# Patient Record
Sex: Female | Born: 1960 | Race: White | Hispanic: No | Marital: Married
Health system: Midwestern US, Academic
[De-identification: ages and names within clinical notes are randomized; demographics above are authoritative.]

---

## 2020-11-16 IMAGING — US ABDCM
1 series · 13 of 25 positions shown · non-contrast
Comparison: none

[Series 1: us abdomen complete · 13 of 47 slices shown]
[im 1/47]
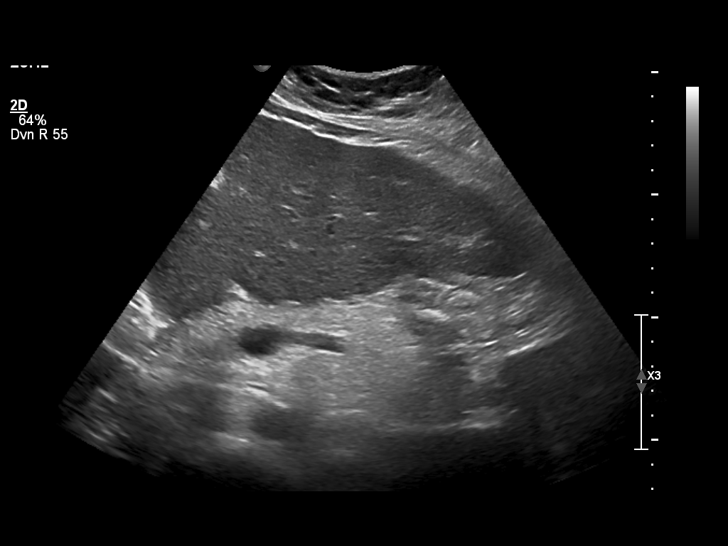
[im 4/47]
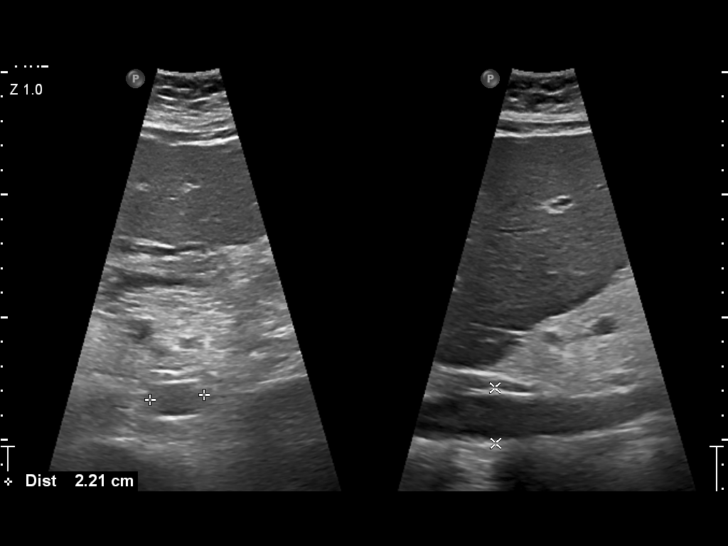
[im 8/47]
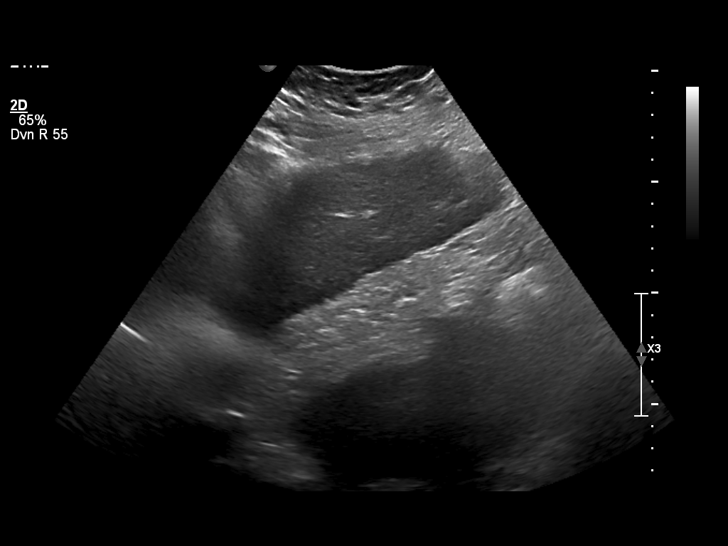
[im 12/47]
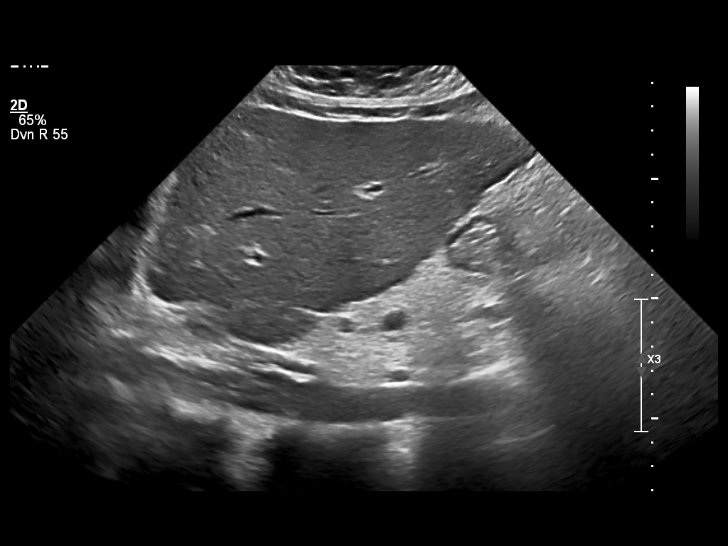
[im 16/47]
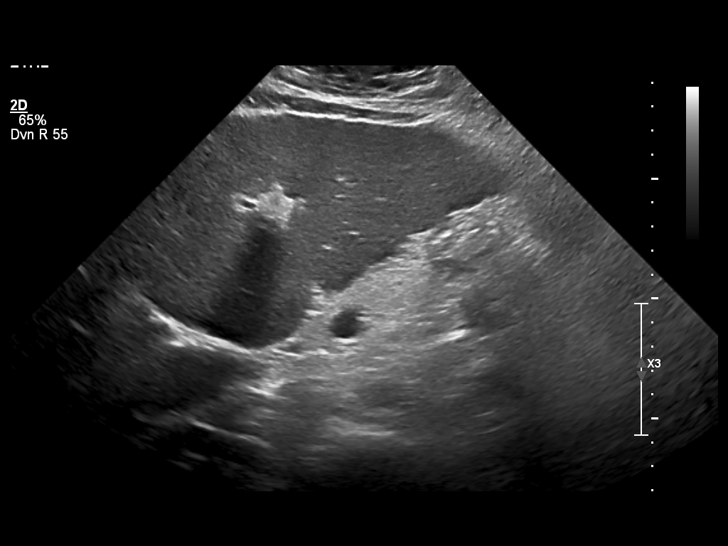
[im 20/47]
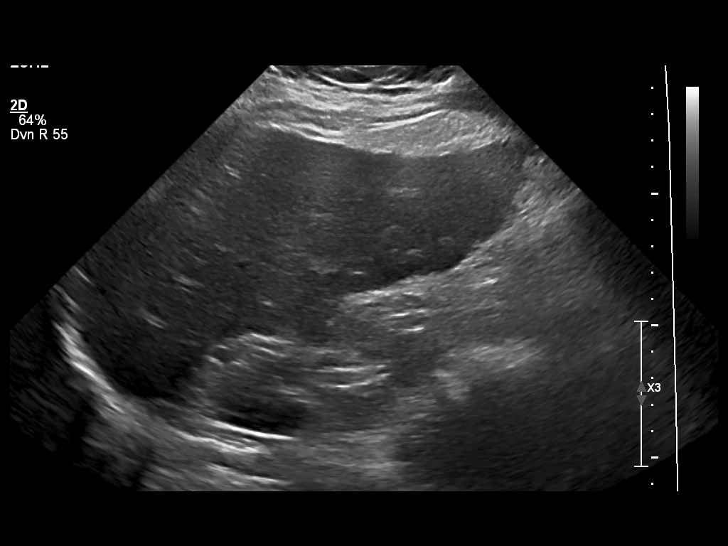
[im 24/47]
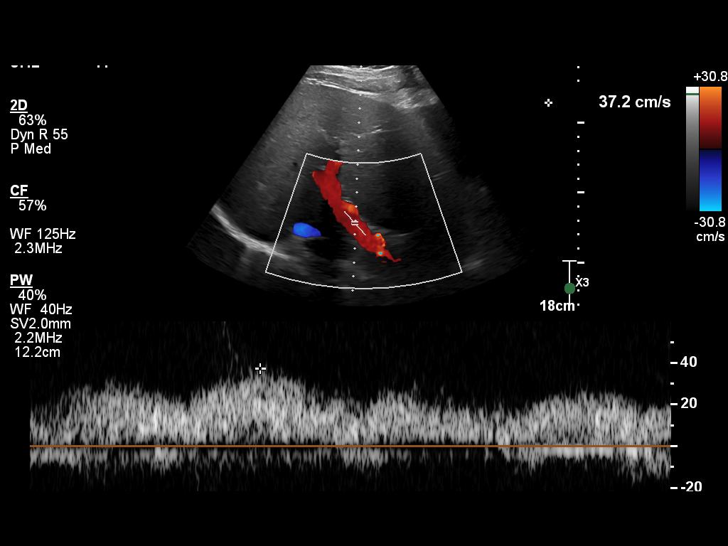
[im 27/47]
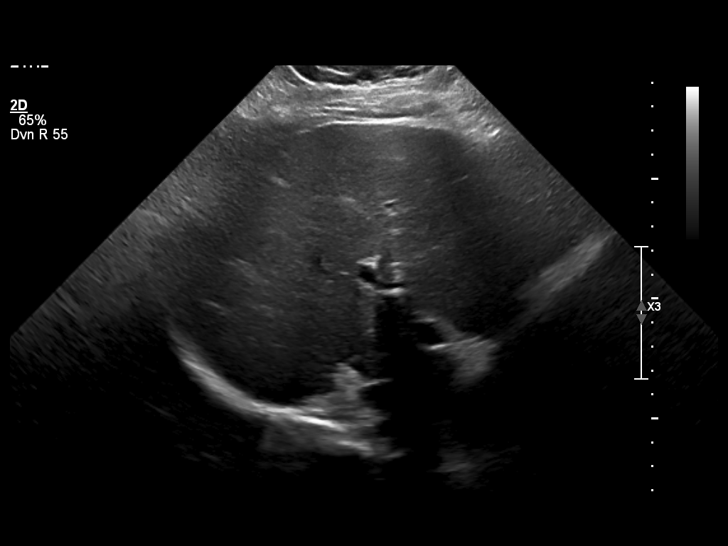
[im 31/47]
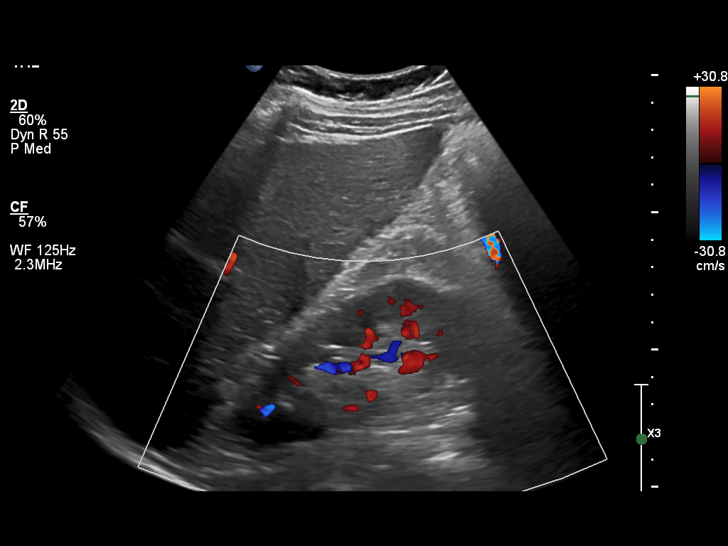
[im 35/47]
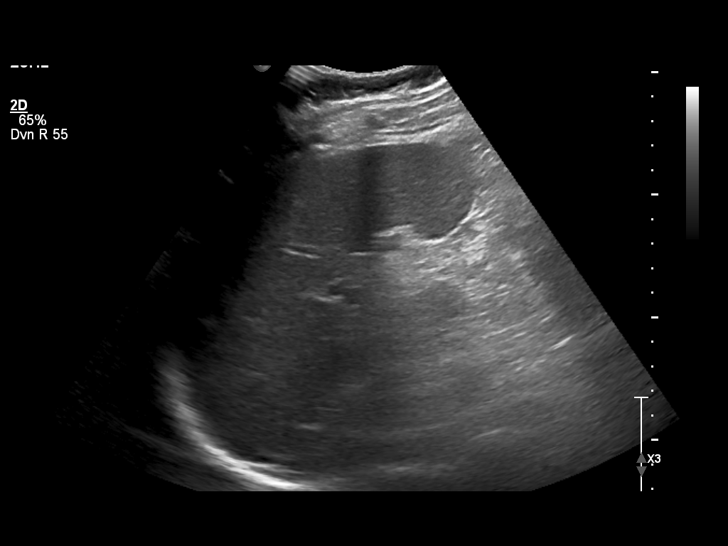
[im 39/47]
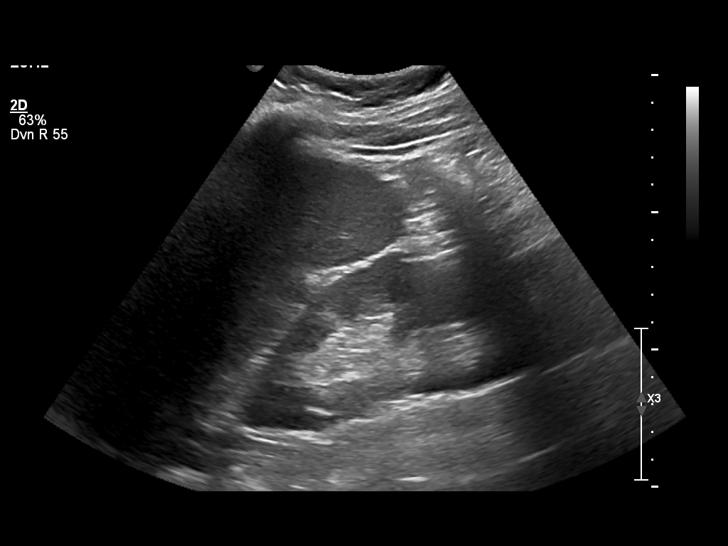
[im 43/47]
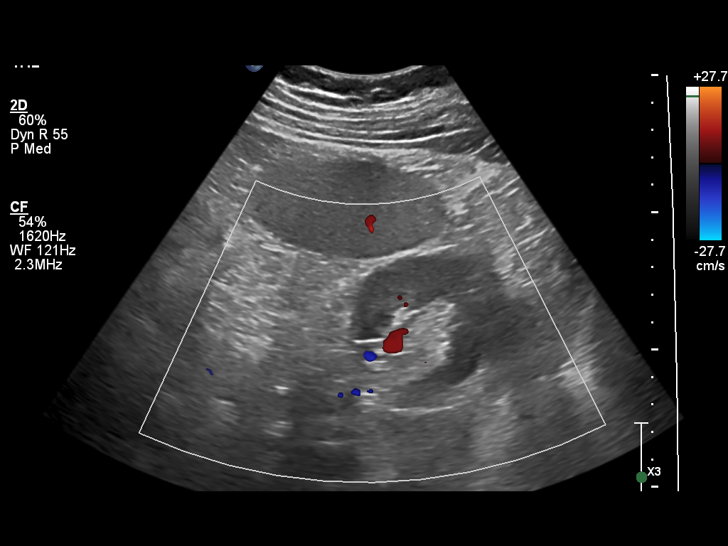
[im 47/47]
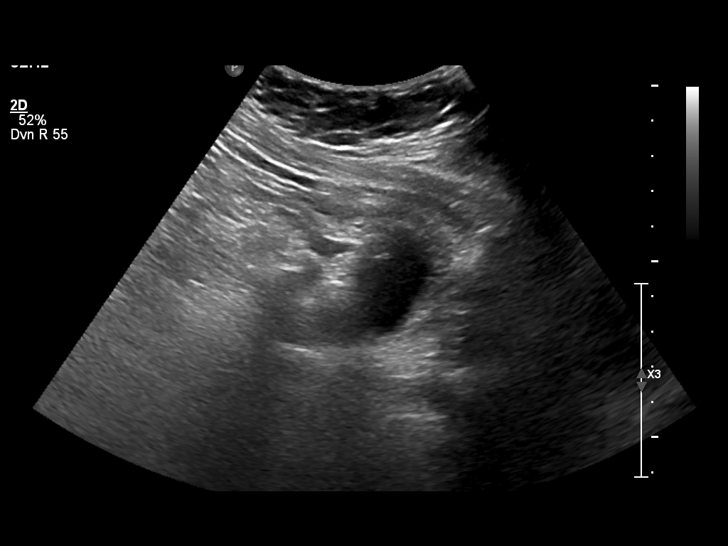

[13 of 25 positions shown; findings below may reference images not displayed]

EXAM

ULTRASOUND, ABDOMINAL, REAL TIME WITH IMAGE DOCUMENTATION, COMPLETE; CPT 94944

INDICATION

Cirrhosis. TB

TECHNIQUE

Multiple static grayscale images of the abdomen were generated utilizing a curved 5MHz probe.

COMPARISONS

Reference is made to CT scan of the abdomen and pelvis dated 07/01/2020.

FINDINGS

The liver measures approximately 18.2 cm in length. This is consistent with hepatomegaly. Nodular
contour of the liver is in keeping with the patient's underlying diagnosis of cirrhosis. There are
no focal hepatic masses identified.

The spleen is enlarged measuring 15 cm. No focal masses are noted. This is suggestive of some
degree of portal hypertension.

There is no evidence of gallstones, pericholecystic fluid or focal gallbladder wall thickening. The
common bile duct measures 4.4 mm. The gallbladder is surgically absent.

The right kidney measures 11.4 x 4.7 x 5.5 cm. The left kidney measures  12 x 5.4 x 4.6 cm. There
are no solid or cystic renal masses. There is no evidence of hydronephrosis or obstructive uropathy.
The renal echotexture is within normal limits.

The aorta and inferior vena cava appear unremarkable. Pancreatic head and body appear within normal
limits. The pancreatic tail is obscured by overlying bowel gas.

IMPRESSION

1. Hepatosplenomegaly. Nodular contour of the liver is in keeping with the patient's underlying
diagnosis of cirrhosis. There are no focal hepatic masses are identified.

2. The gallbladder is surgically absent. The pancreatic tail is obscured by overlying bowel gas.

Tech Notes:

TB

## 2020-12-08 IMAGING — MR L-spine^Routine
4 series · 15 of 15 positions shown · non-contrast
Comparison: none

[Series 2: T2 · sagittal · 4.0mm · 0.62mm/px · 5 of 5 slices shown]
[im 1/5]
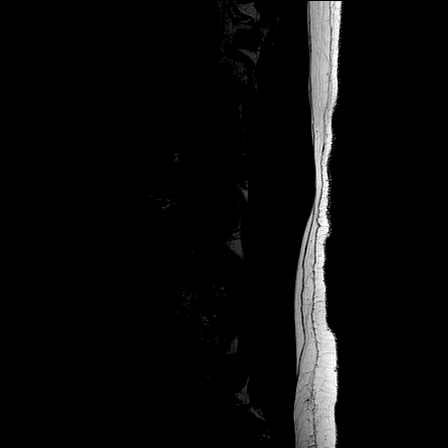
[im 2/5]
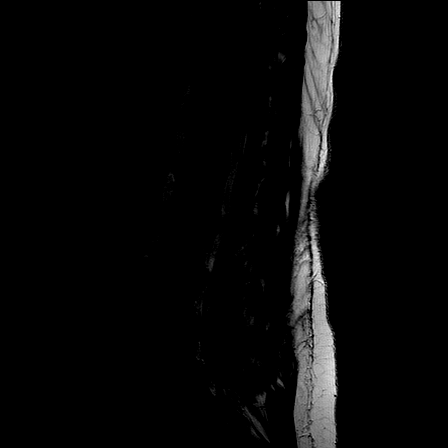
[im 3/5]
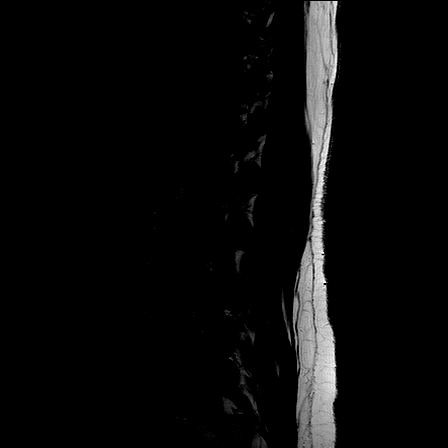
[im 4/5]
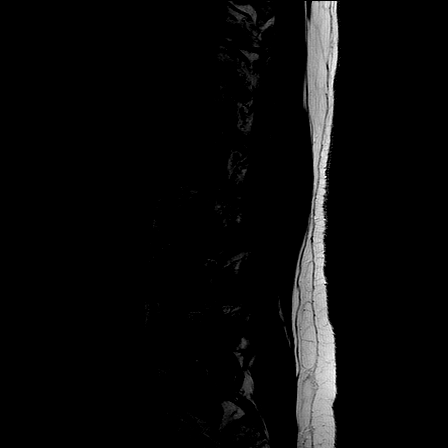
[im 5/5]
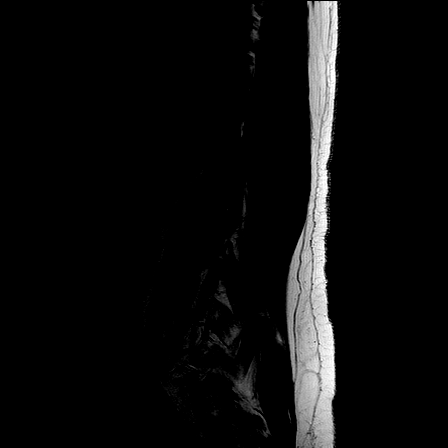

[Series 3: T1 · sagittal · 4.0mm · 0.73mm/px · 6 of 6 slices shown (1 of 2)]
[im 1/6]
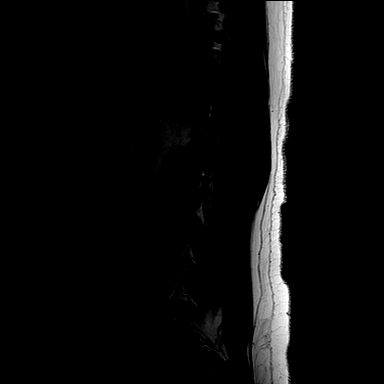
[im 2/6]
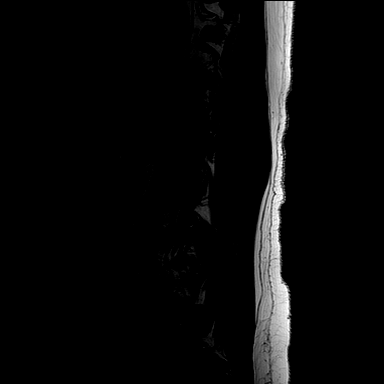
[im 3/6]
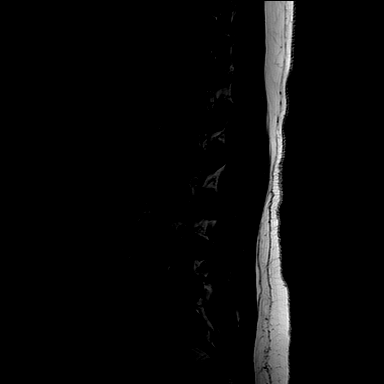
[im 4/6]
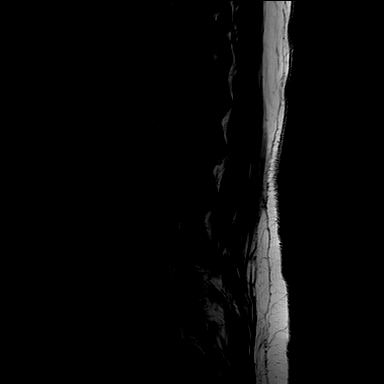
[im 5/6]
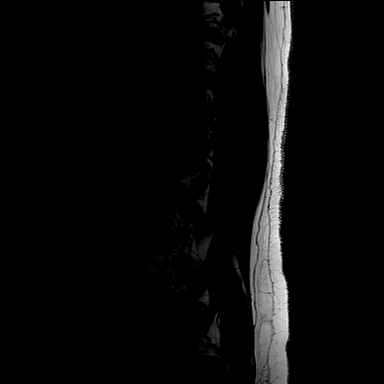
[im 6/6]
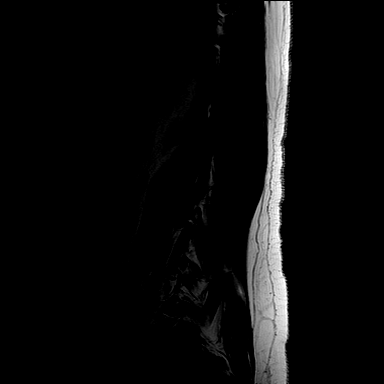

[Series 4: STIR · sagittal · 4.0mm · 0.55mm/px · 3 of 3 slices shown]
[im 1/3]
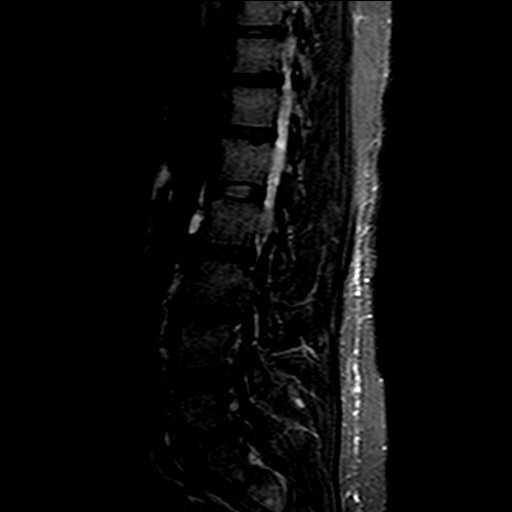
[im 2/3]
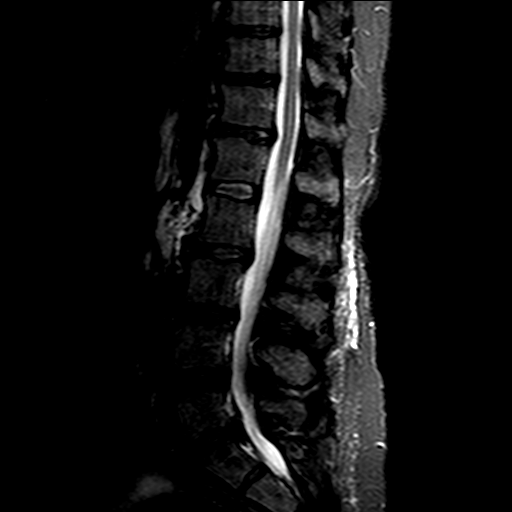
[im 3/3]
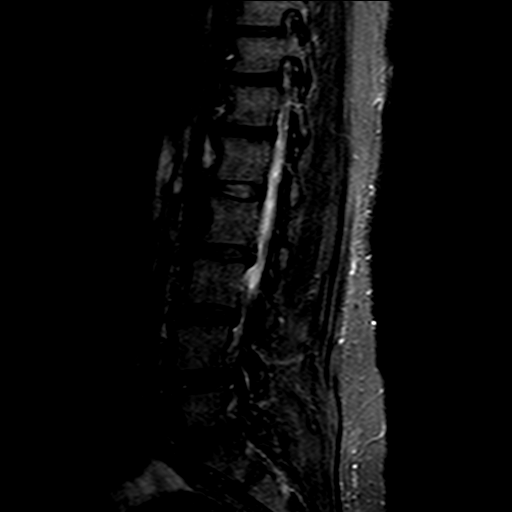

[Series 6: T1 · axial · 4.5mm · 0.86mm/px · 1 of 1 slices shown (2 of 2)]
[im 1/1]
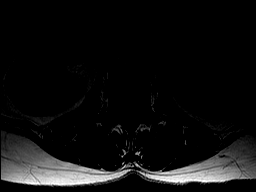

[15 of 15 positions shown; findings below may reference images not displayed]

EXAM

MRI lumbar spine without contrast

INDICATION

back pain
CHRONIC WORSENING LOW BACK PAIN WITH RADIATION DOWN BILATERAL LEGS WITH NUMBNESS TO FEET.  RG

FINDINGS

Sagittal T1, T2 and stir and axial T1 and T2 weighted images of the lumbar spine were obtained.

Lumbar vertebral body heights and alignment appear normal.

At L1-2, there is no disc herniation or nerve root abnormality. There is mild facet joint
osteoarthrosis.

At L2-3, there is mild disc space narrowing. There is mild narrowing of the facet joint spaces with
small osteophytes. There is no disc herniation.

At L3-4, there is a small broad-based disc bulge which extends laterally into the neural foramina
bilaterally. There is mild bilateral neural foraminal stenosis bilaterally. There is mild facet
joint osteoarthrosis.

At L4-5, there is a small broad-based disc protrusion. This extends laterally into the neural
foramina bilaterally. There is moderate bilateral neural foraminal stenosis due to a disc and
osteophytes. There is facet joint osteoarthrosis with osteophytes and ligamentum flavum hypertrophy.
There is mild spinal stenosis.

At L5-S1, there is disc space narrowing. There is a small to moderately sized right paracentral
disc protrusion which occupies and narrows the right lateral recess and slightly displaces the
traversing right S1 nerve root. There is moderate right neural foraminal stenosis due to disc and
osteophyte. There is also moderate left neural foraminal stenosis due to disc protrusion and
osteophyte. There is facet joint osteoarthrosis.

IMPRESSION

There is a small broad-based disc protrusion at L4-5 extending laterally into the neural foramina
with moderate bilateral neural foraminal stenosis due to a disc and osteophytes. There is mild
spinal stenosis.

At L5-S1 there is a small to moderately sized right paracentral disc protrusion which also extends
laterally and results in moderate right neural foraminal stenosis, contributed to by osteophytes.
There is moderate left neural foraminal stenosis due to disc protrusion osteophytes.

There is mild spondylosis of the upper and mid lumbar levels.

There is no acute bone abnormality.

Tech Notes:

CHRONIC WORSENING LOW BACK PAIN WITH RADIATION DOWN BILATERAL LEGS WITH NUMBNESS TO FEET.  RG

## 2021-01-19 IMAGING — RF FL guided spine inject
1 series · 2 of 2 positions shown · non-contrast
Comparison: none

[Series 1: run · 2 of 2 slices shown]
[im 1/2]
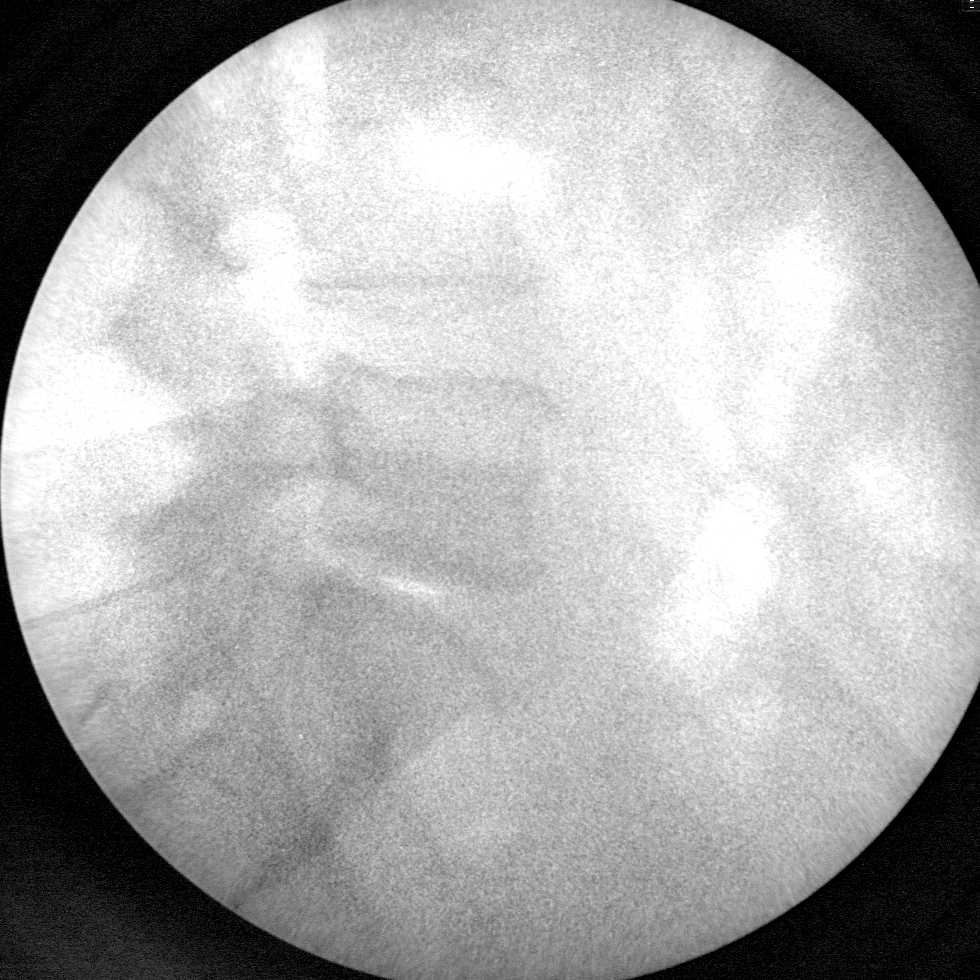
[im 2/2]
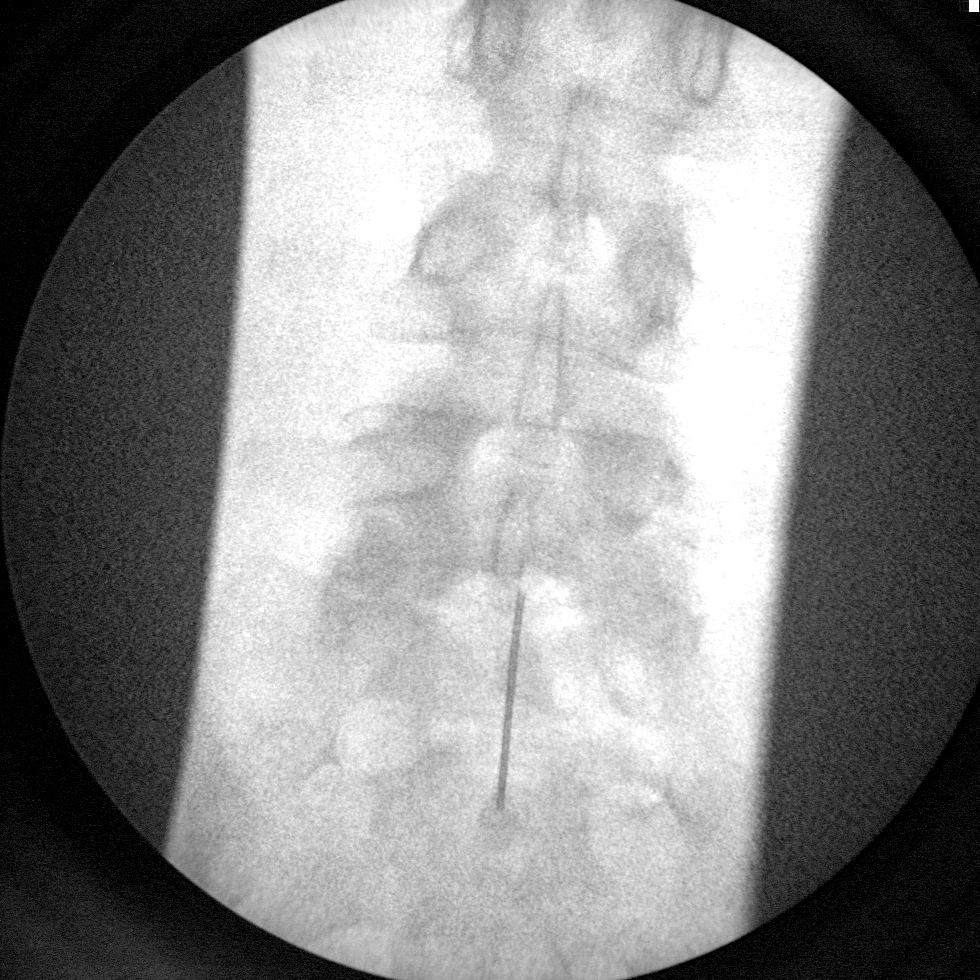

[2 of 2 positions shown; findings below may reference images not displayed]

DIAGNOSTIC STUDIES

EXAM

Fluoroscopic spinal injection.

INDICATION

Fluoroscopic guidance of lumbar epidural steroid
AB

TECHNIQUE

Fluoro time is 14.4 seconds. Two spot films and cine clip were obtained.

COMPARISONS

None available

FINDINGS

Please see procedure note for full details. Fluoroscopic guidance was provided for spinal injection

IMPRESSION

Fluoroscopic guidance for spinal injection.

Tech Notes:

AB

## 2021-08-29 ENCOUNTER — Encounter: Admit: 2021-08-29 | Discharge: 2021-08-29 | Payer: No Typology Code available for payment source

## 2021-08-29 NOTE — Patient Instructions
It was nice to see you today.  Thank you for choosing to visit our clinic.  Your time is important, and if you had to wait today, we do apologize.  Our goal is to run exactly on time.  However, on occasion, we get behind in clinic due to unexpected patient issues.  Thank you for your patience.    General Instructions:  Scheduling:  Our scheduling phone number is [phone].  Appointment Reminders on your cell phone:  Communication preferences can be managed in MyChart to ensure you receive important appointment notifications  How to reach our office:  Please send a MyChart message to the [facility] (directed to Dr. Cordell) or leave a voicemail for the nurse, Kainoah Bartosiewicz, at [phone].  Support for many chronic illnesses is available through [facility] at [email or web address] or [phone].    For help with MyChart:  please call [phone].    For more information on spinal conditions:  please visit [email or web address]     Again, thank you for coming in today.

## 2021-08-30 ENCOUNTER — Ambulatory Visit: Admit: 2021-08-30 | Discharge: 2021-08-30 | Payer: No Typology Code available for payment source

## 2021-08-30 ENCOUNTER — Encounter: Admit: 2021-08-30 | Discharge: 2021-08-30 | Payer: No Typology Code available for payment source

## 2021-08-30 DIAGNOSIS — M4800 Spinal stenosis, site unspecified: Secondary | ICD-10-CM

## 2021-08-30 DIAGNOSIS — IMO0002 Ulcer: Secondary | ICD-10-CM

## 2021-08-30 DIAGNOSIS — M545 Chronic low back pain, unspecified back pain laterality, unspecified whether sciatica present: Secondary | ICD-10-CM

## 2021-08-30 DIAGNOSIS — I10 Essential (primary) hypertension: Secondary | ICD-10-CM

## 2021-08-30 DIAGNOSIS — M5136 Other intervertebral disc degeneration, lumbar region: Secondary | ICD-10-CM

## 2021-08-30 DIAGNOSIS — M2550 Pain in unspecified joint: Secondary | ICD-10-CM

## 2021-08-30 DIAGNOSIS — M47816 Spondylosis without myelopathy or radiculopathy, lumbar region: Secondary | ICD-10-CM

## 2021-08-30 DIAGNOSIS — F419 Anxiety disorder, unspecified: Secondary | ICD-10-CM

## 2021-08-30 DIAGNOSIS — F32A Depression: Secondary | ICD-10-CM

## 2021-08-30 DIAGNOSIS — R011 Cardiac murmur, unspecified: Secondary | ICD-10-CM

## 2021-08-30 DIAGNOSIS — R519 Generalized headaches: Secondary | ICD-10-CM

## 2021-08-30 DIAGNOSIS — K7460 Unspecified cirrhosis of liver: Secondary | ICD-10-CM

## 2021-08-30 NOTE — Progress Notes
SPINE CENTER HISTORY AND PHYSICAL    Chief Complaint   Patient presents with   ? New Patient     Low back, hip, & leg pain (right side worse)                 Vitals:    08/30/21 1201   BP: 132/60   Pulse: 67   Temp: 36.6 ?C (97.8 ?F)   Resp: 20   SpO2: 97%   TempSrc: Oral   PainSc: Eight   Weight: 88.5 kg (195 lb)   Height: 158.8 cm (5' 2.5)        Medical History:   Diagnosis Date   ? Anxiety 12/18/1998   ? Cirrhosis (HCC) 02/23/2019   ? 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 Depression 12/18/1998   ? Essential hypertension 12/18/2016   ? Generalized headaches     occasionally   ? Heart murmur 12/18/2017   ? Joint pain 12/19/2015   ? Spinal stenosis 06/22/2019   ? Ulcer 02/23/2019         Surgical History:   Procedure Laterality Date   ? HX ARTHROSCOPIC SURGERY  12/18/2000   ? HX HYSTERECTOMY  12/19/1999   ? HX JOINT REPLACEMENT  10/04/2001   ? KNEE SURGERY  10/04/2001         Allergies   Allergen Reactions   ? Morphine ANXIETY, ITCHING and REDNESS     Itchy, increased anxiety            Current Outpatient Medications on File Prior to Visit   Medication Sig Dispense Refill   ? amlodipine-atorvastatin (CADUET) 10-80 mg tablet      ? losartan (COZAAR) 50 mg tablet      ? sertraline (ZOLOFT) 100 mg tablet Zoloft 100 mg tablet     ? traMADoL (ULTRAM) 50 mg tablet TWICE A DAY     ? traZODone (DESYREL) 50 mg tablet      ? vitamins, multiple tablet Take 1 tablet by mouth daily.       No current facility-administered medications on file prior to visit.         family history includes Alcohol liver disease in her father; Arthritis in her mother; Back pain in her father; Diabetes in her paternal grandmother; Joint Pain in her father; Stroke in her father and paternal grandmother.      Social History     Socioeconomic History   ? Marital status: Married   Tobacco Use   ? Smoking status: Former Smoker     Packs/day: 0.00     Years: 10.00     Pack years: 0.00     Types: Cigarettes     Quit date: 02/23/2019     Years since quitting: 2.5   ? Smokeless tobacco: Never Used   ? Tobacco comment: Was not a constant smoker   Substance and Sexual Activity   ? Alcohol use: Not Currently   ? Drug use: Never   ? Sexual activity: Not Currently     Partners: Male     Birth control/protection: Pill         Review of Systems       HPI / PHYSICAL EXAM / RADIOGRAPHIC EVALUATION /  IMPRESSION / PLAN      Dictation on: 08/30/2021  1:59 PM by: Wyona Almas [[email or web address]]                Total time 60 minutes.

## 2021-08-31 ENCOUNTER — Encounter: Admit: 2021-08-31 | Discharge: 2021-08-31 | Payer: No Typology Code available for payment source

## 2021-08-31 NOTE — Telephone Encounter
Megan from Dr. Estrellita Ludwig office calling for fax number.  RN called Aundra Millet to discuss.  EMG 08/14/21 already received.

## 2021-09-04 IMAGING — CR PAPERORDER
3 series · 3 of 3 positions shown · non-contrast
Comparison: none

[shoulder external]
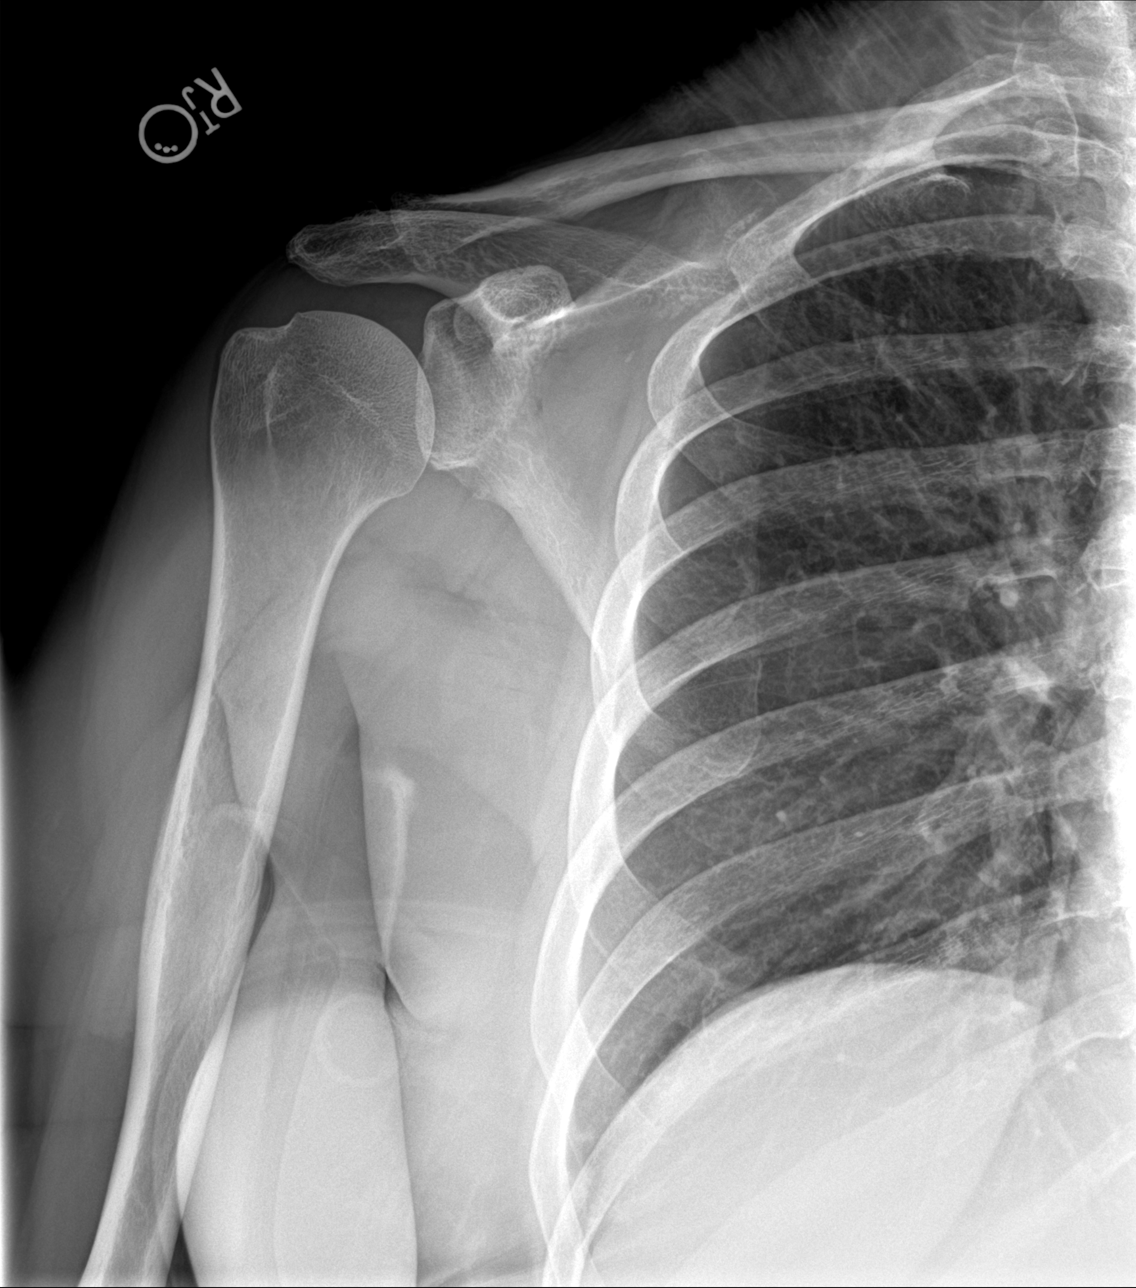

[shoulder internal]
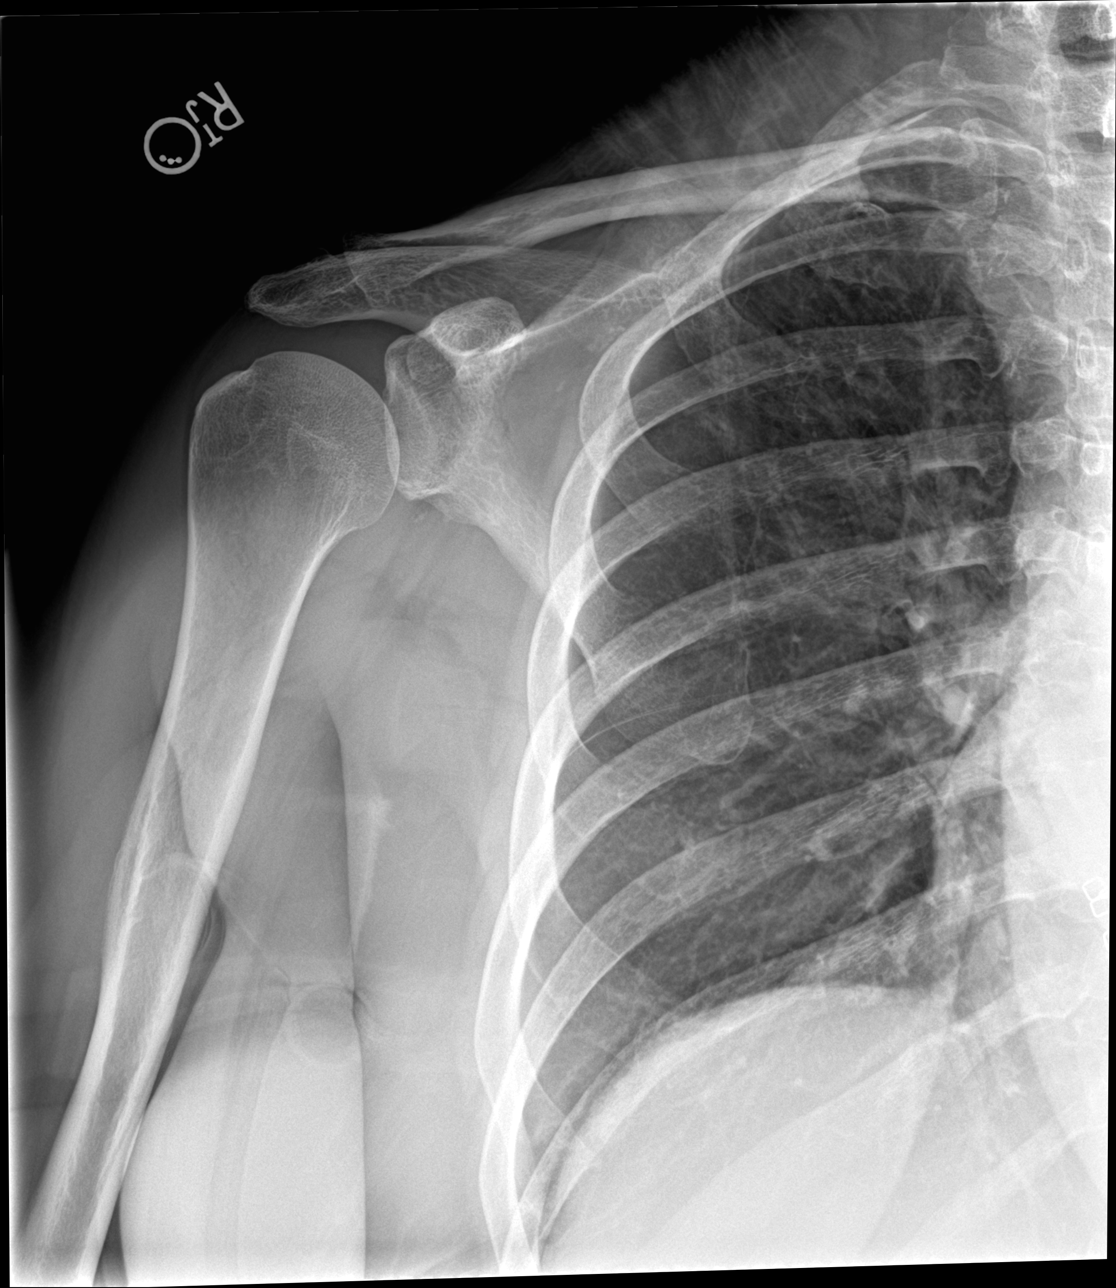

[shoulder y-view]
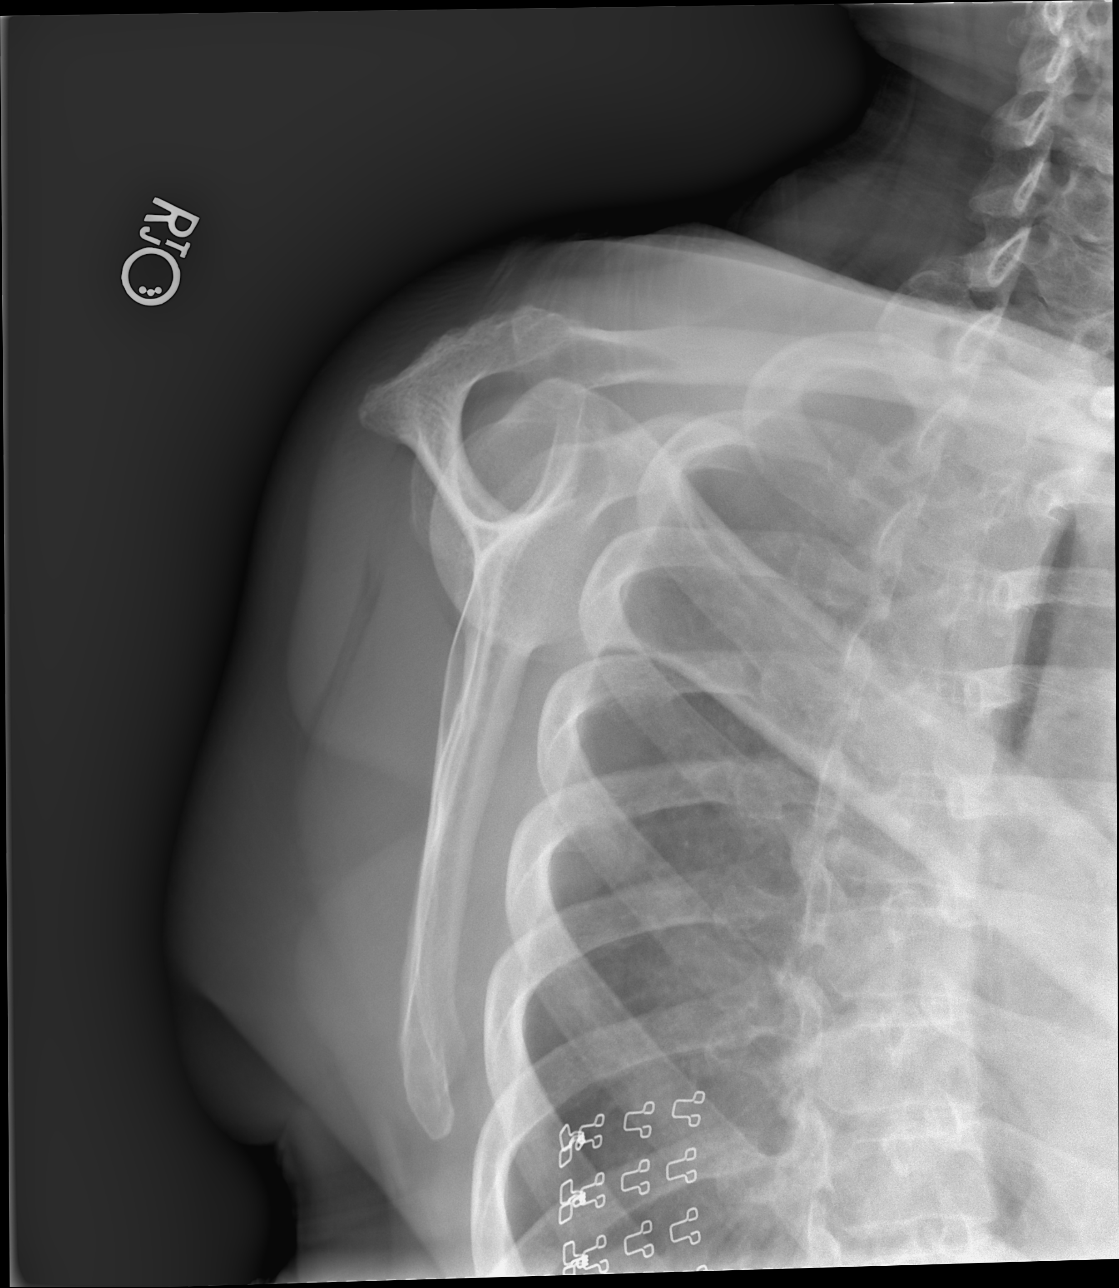

[3 of 3 positions shown; findings below may reference images not displayed]

EXAM

RADIOLOGICAL EXAMINATION, SHOULDER

INDICATION

PT STATES HAS CHRONIC PAIN. MAINLY RIGHT SIDED. TJ

COMPARISONS

None

FINDINGS

The bone density is maintained. There is no destruction.

The glenohumeral joint is intact. There is no dislocation.

The AC joint is intact on these nonstress views. Mild AC joint degenerative changes are present.

There is no displaced fracture.

IMPRESSION

There is no fracture or dislocation.

Tech Notes:

PT STATES HAS CHRONIC PAIN. MAINLY RIGHT SIDED. TJ

## 2021-09-04 IMAGING — CR KNEELMRT
2 series · 2 of 2 positions shown · non-contrast
Comparison: none

[knee ap]
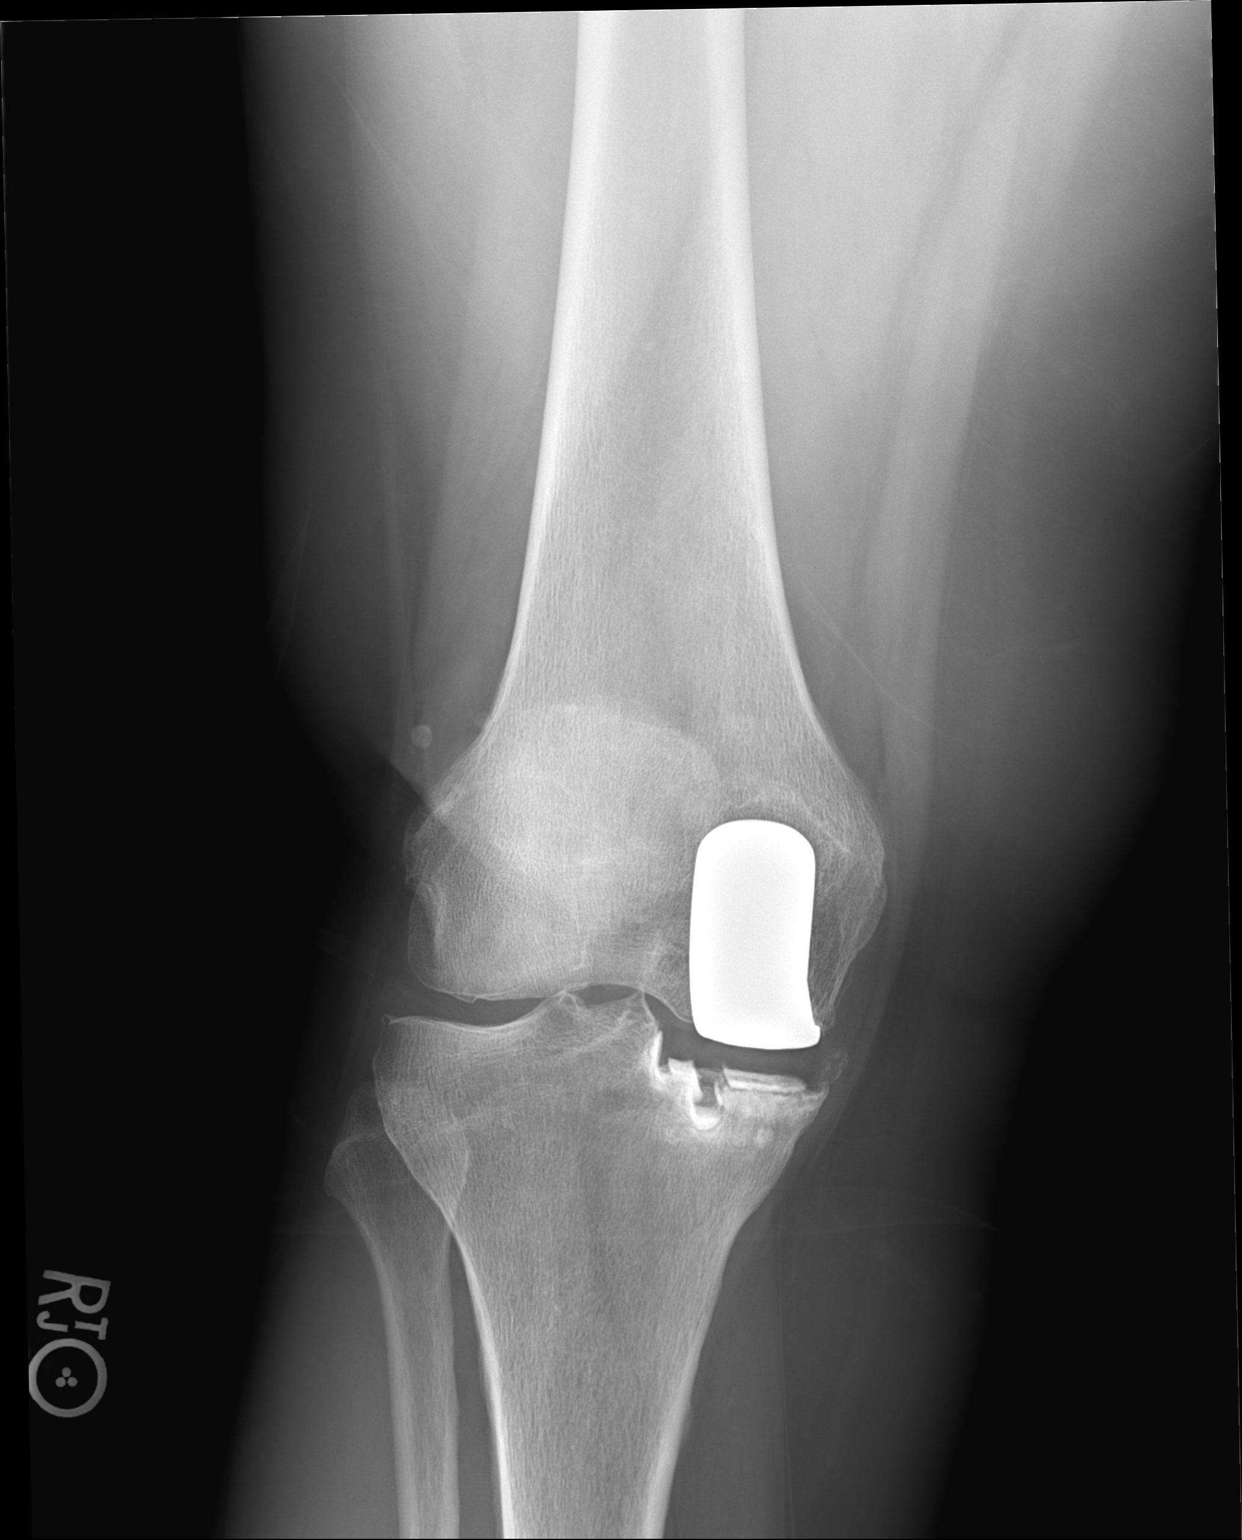

[knee lat]
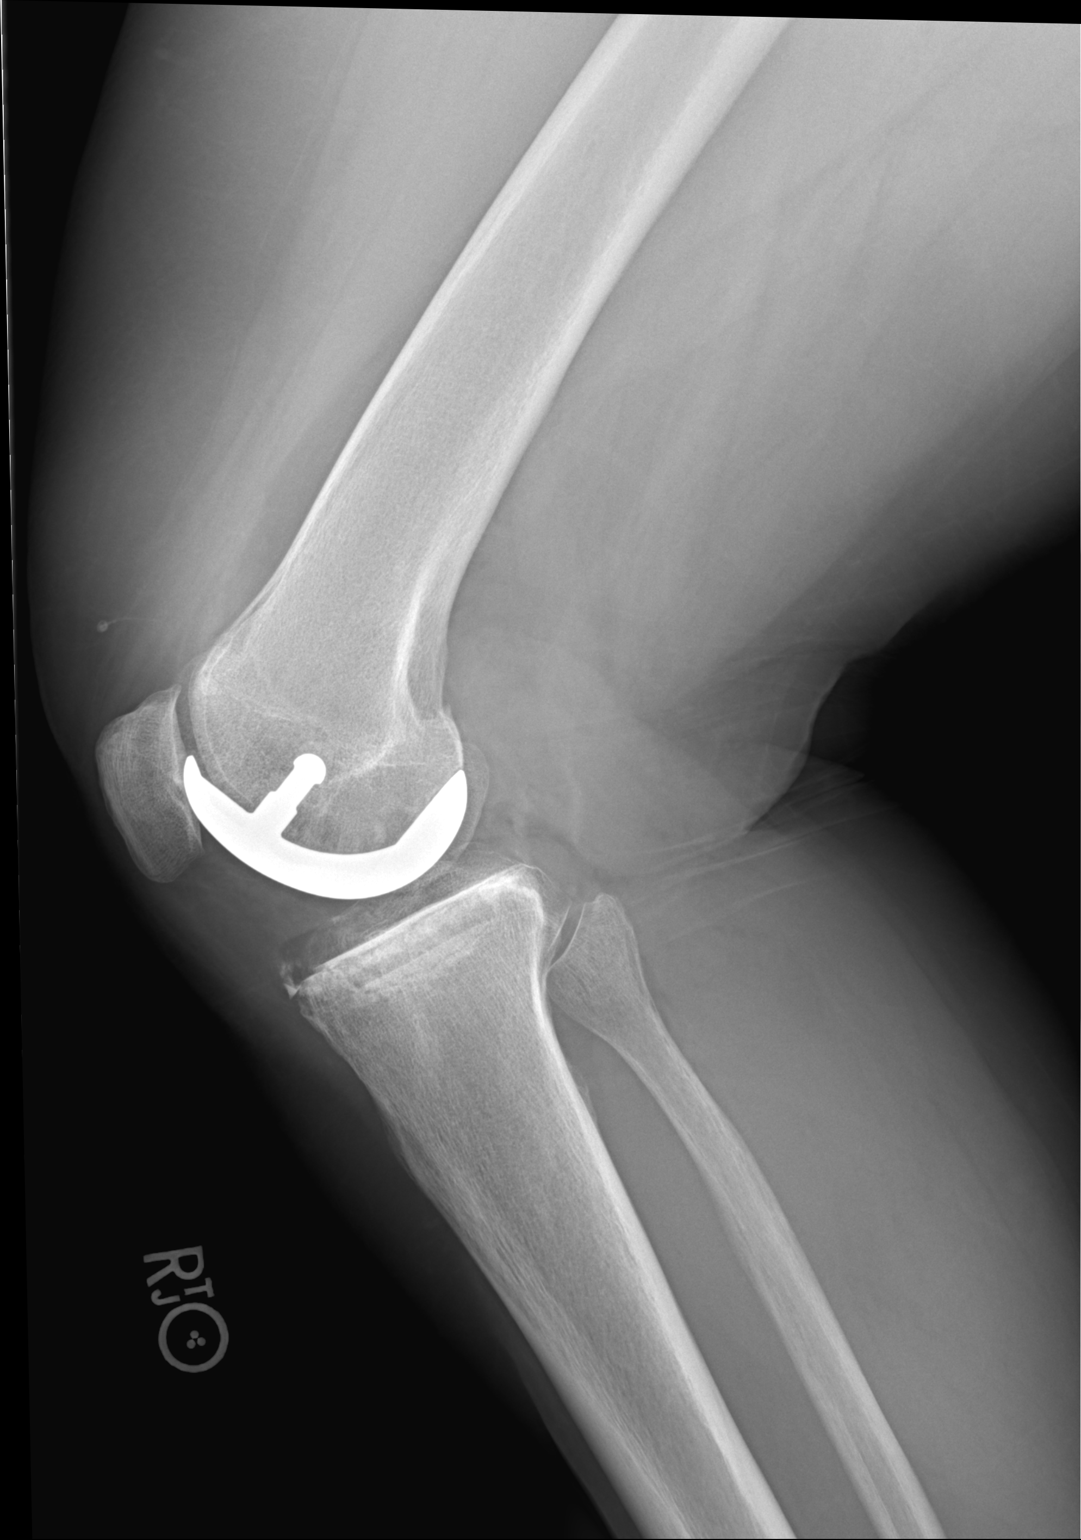

[2 of 2 positions shown; findings below may reference images not displayed]

EXAM

RADIOLOGICAL EXAMINATION, KNEE

INDICATION

PAIN
PT STATES HAS CHRONIC PAIN. MAINLY RIGHT SIDED. TJ

COMPARISONS

None

FINDINGS

Previous surgical changes of a medial right knee hemiarthroplasty is noted. The components appear
intact. There is mild sclerosis beneath the tibial component and faint lucency. There is no acute
bony abnormalities such is fracture-dislocation

Minimal joint effusion is present.

IMPRESSION

Surgical changes of a medial hemiarthroplasty of the right knee is identified. If old films are
available for comparison this be helpful to evaluate the tibial component. There is sclerosis of the
of the adjacent tibia and faint lucency beneath the tibial component. No fracture dislocation seen.

Tech Notes:

PT STATES HAS CHRONIC PAIN. MAINLY RIGHT SIDED. TJ

## 2021-09-04 IMAGING — CR HIPCMRT
3 series · 3 of 3 positions shown · non-contrast
Comparison: none

[hip ap pelvis]
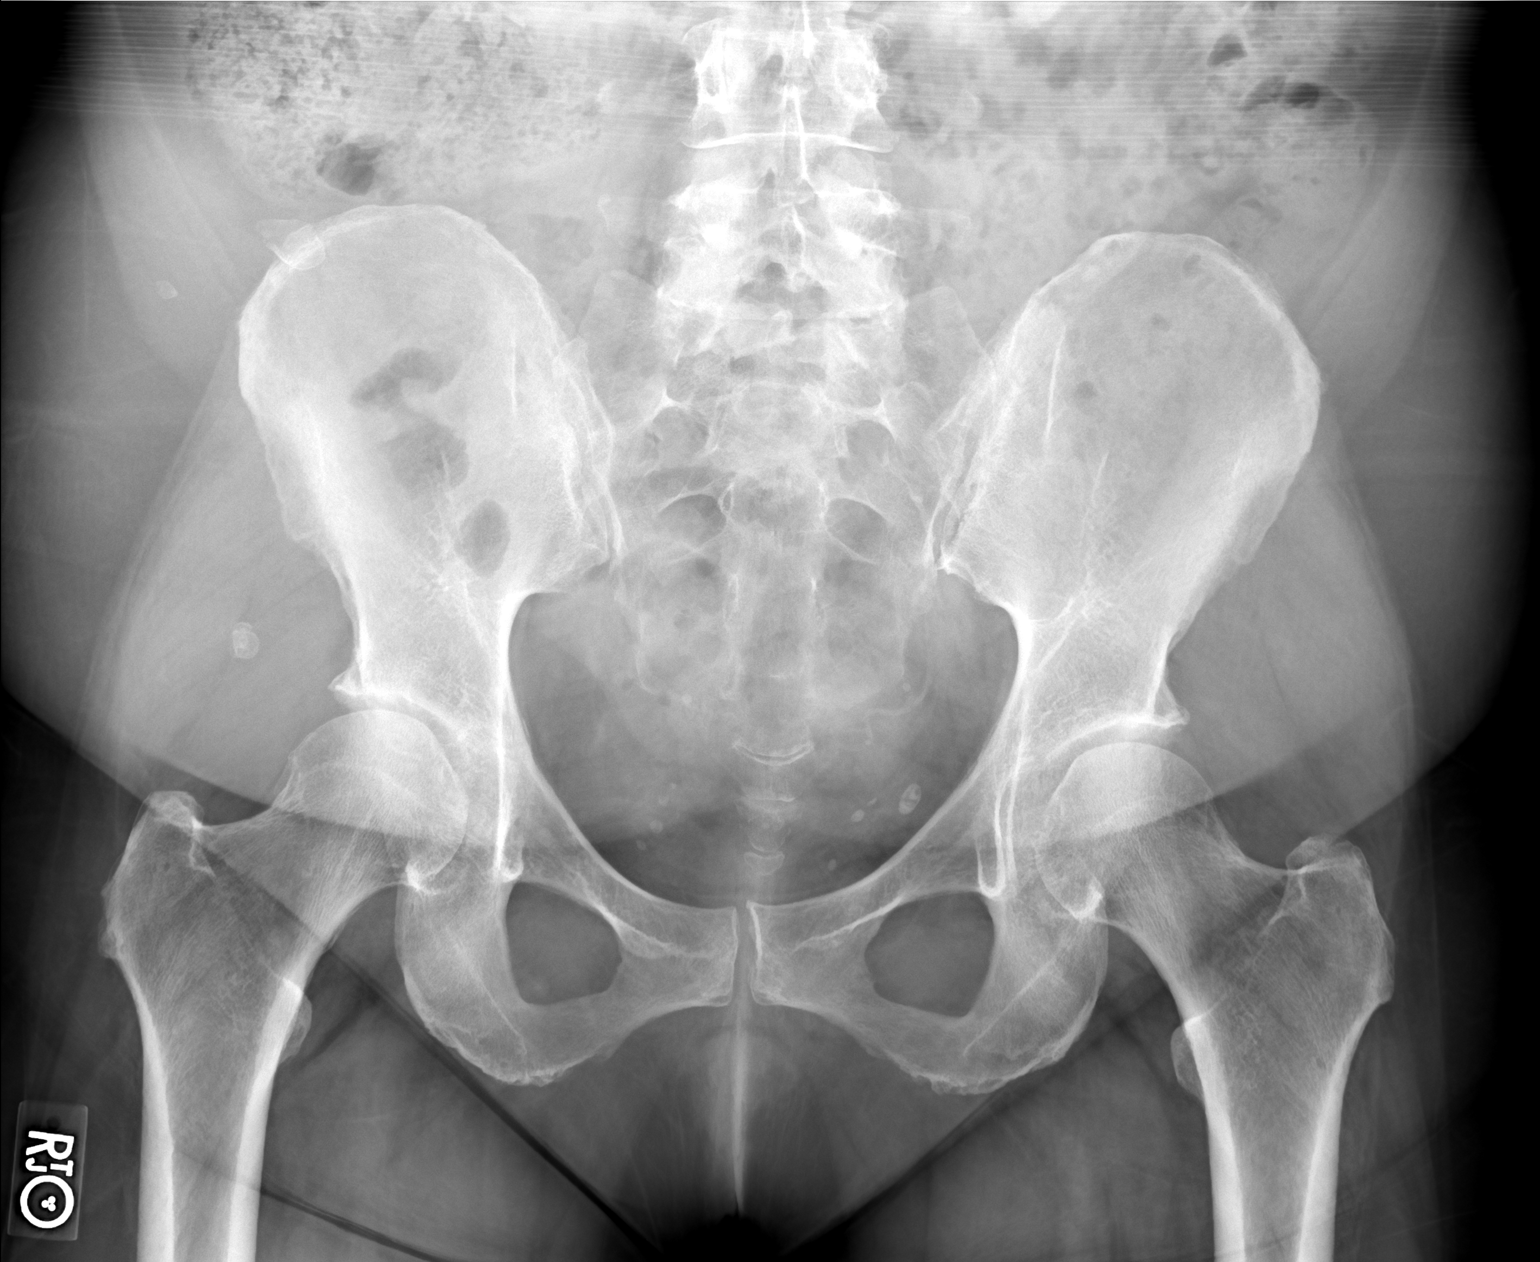

[hip ap]
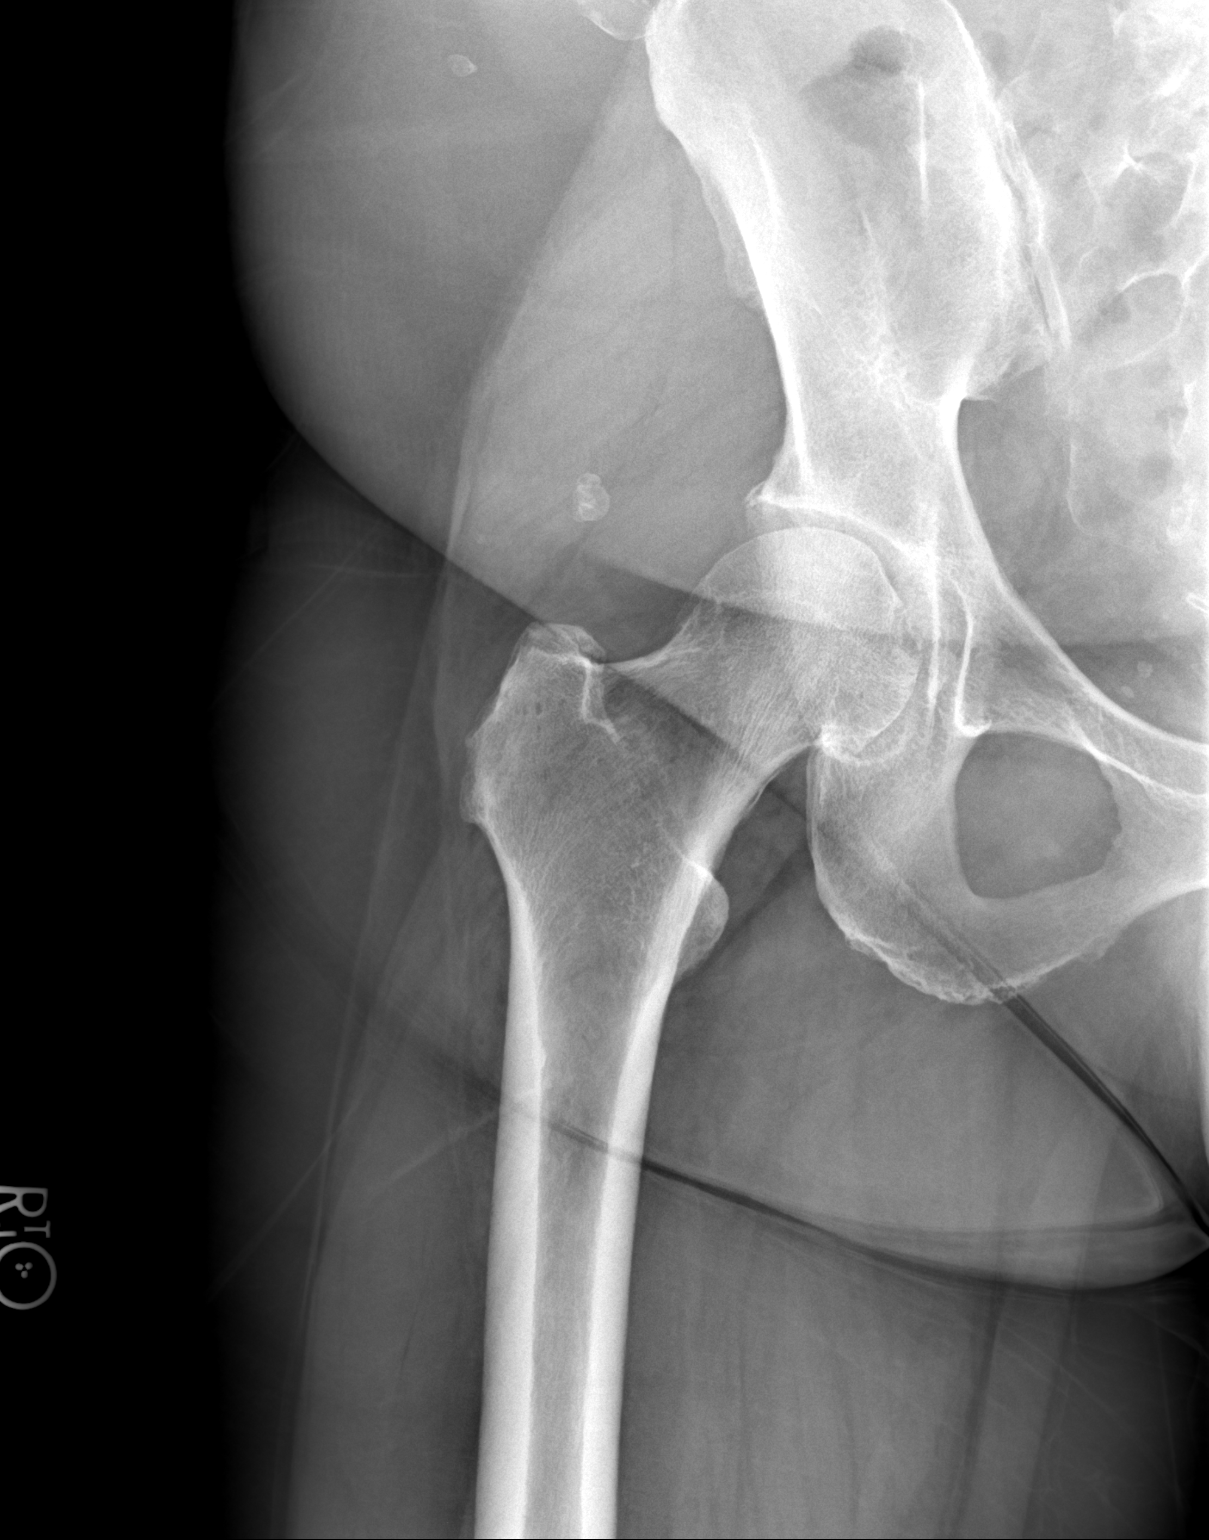

[hip frog lat]
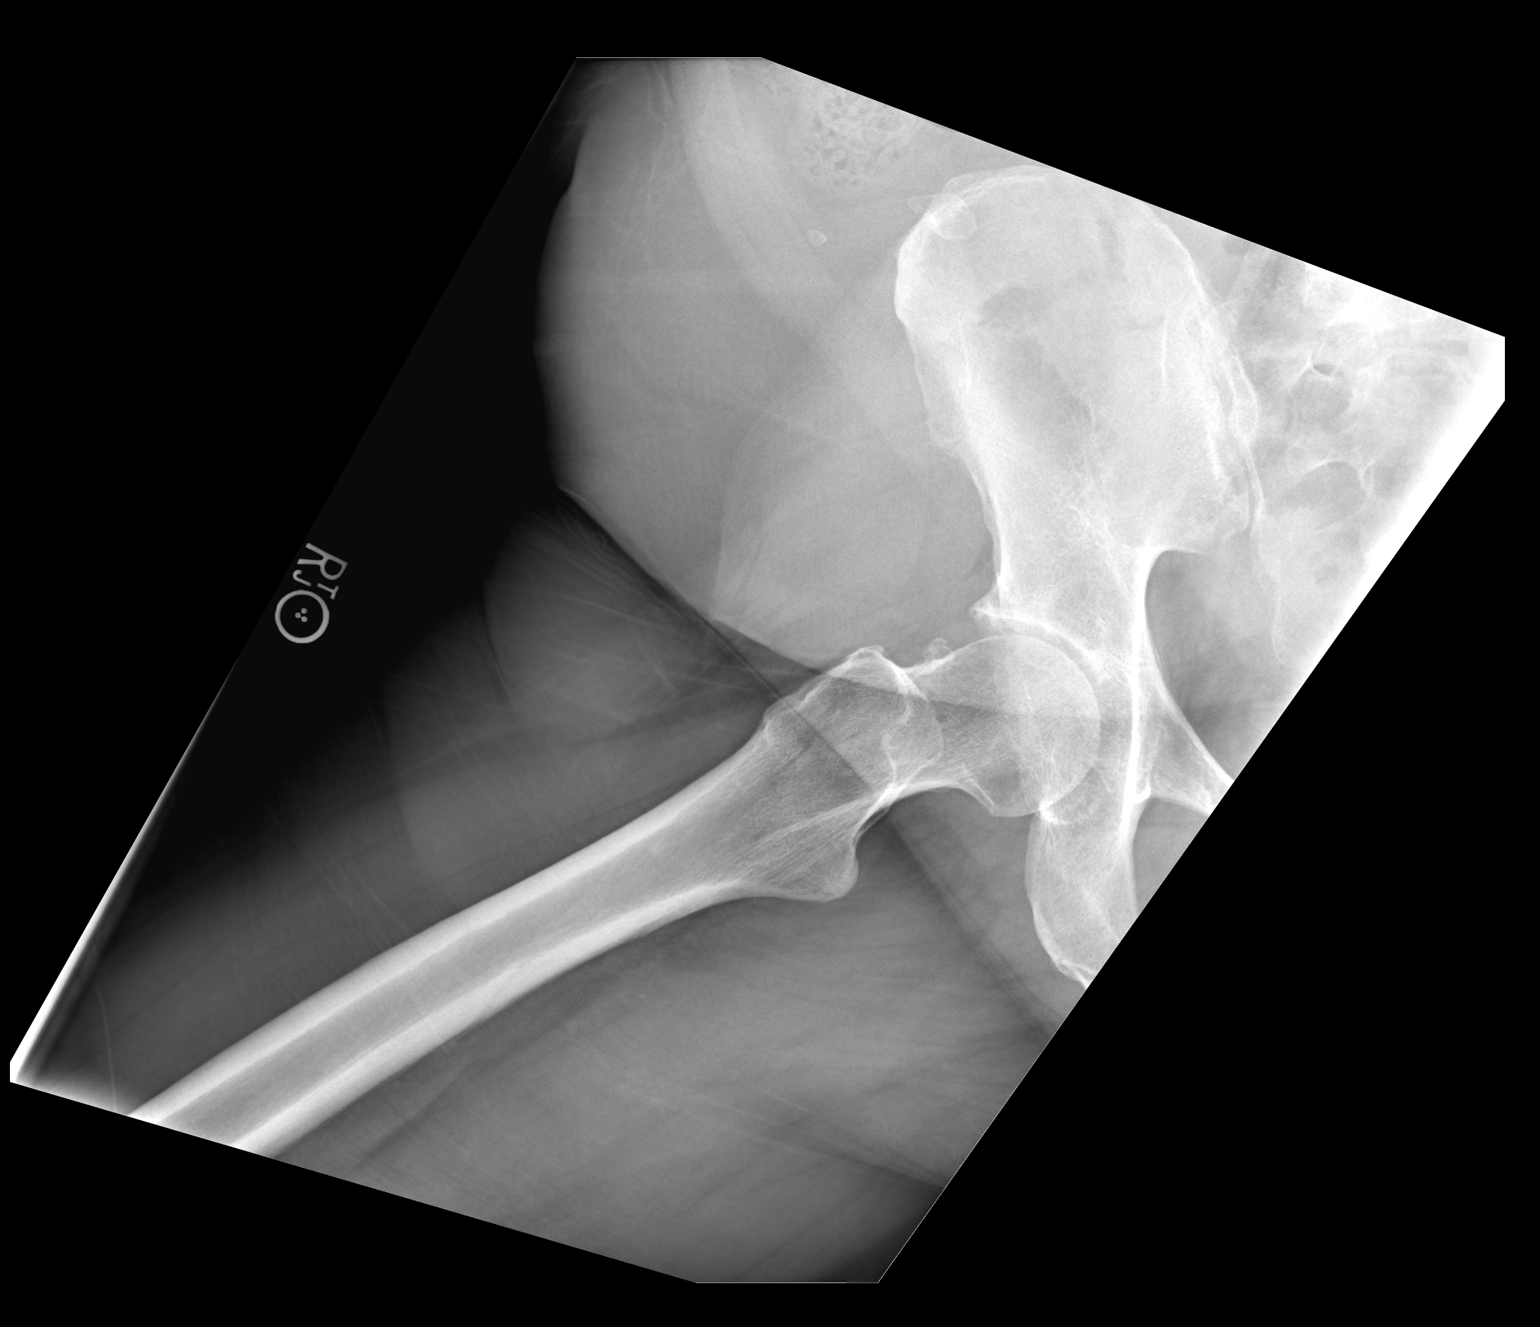

[3 of 3 positions shown; findings below may reference images not displayed]

EXAM

RADIOLOGICAL EXAMINATION, HIP

INDICATION

PAIN
PT STATES HAS CHRONIC PAIN. MAINLY RIGHT SIDED. TJ

TECHNIQUE

2 views of the  hip were acquired.

COMPARISONS

None

FINDINGS

The bone density is maintained. There is no destruction.

The joint space is maintained.

There is no displaced fracture or dislocation.

IMPRESSION

No acute bony abnormality.

Tech Notes:

PT STATES HAS CHRONIC PAIN. MAINLY RIGHT SIDED. TJ

## 2021-09-05 ENCOUNTER — Encounter: Admit: 2021-09-05 | Discharge: 2021-09-05 | Payer: No Typology Code available for payment source

## 2021-09-05 NOTE — Telephone Encounter
Received fax from [facility] with imaging reports:    MRI lower extremity 09/03/2019  Xrays right hip 07/28/2019  Xrays Lspine 07/28/2019  Emailed RIC to request associated images.      Fax received also contained pain management procedures:    08/07/2019:  L4/5 ESI  09/22/2019:  L4/5 ESI  Reports scanned into O2.      RN will update Dr. Suan Halter with this information.

## 2021-09-06 ENCOUNTER — Encounter: Admit: 2021-09-06 | Discharge: 2021-09-06 | Payer: No Typology Code available for payment source

## 2021-09-08 ENCOUNTER — Encounter: Admit: 2021-09-08 | Discharge: 2021-09-08 | Payer: No Typology Code available for payment source

## 2021-09-08 NOTE — Telephone Encounter
Spoke with patient about her pain that she was having in her groin area. She stated she had it was when she saw Dr. Suan Halter at her last visit. She wanted to know the plan on what she should do next. I saw that dr. Suan Halter had ordered PT. I suggested she try that. I let her know I would leave a message for Cassandra .

## 2021-09-19 IMAGING — CR HIPCMRT
3 series · 3 of 3 positions shown · non-contrast
Comparison: No relevant prior studies available.

DIAGNOSTIC STUDIES

EXAM:  XR RIGHT HIP WITH PELVIS WHEN PERFORMED, 2 OR 3 VIEWS  ([id number])
INDICATION: Heard pop/unable to bear weight rt hip pain, heard a pop and unable to bear wt,
TECHNIQUE: Two or three views of the right hip with pelvis when performed.

[hip ap pelvis]
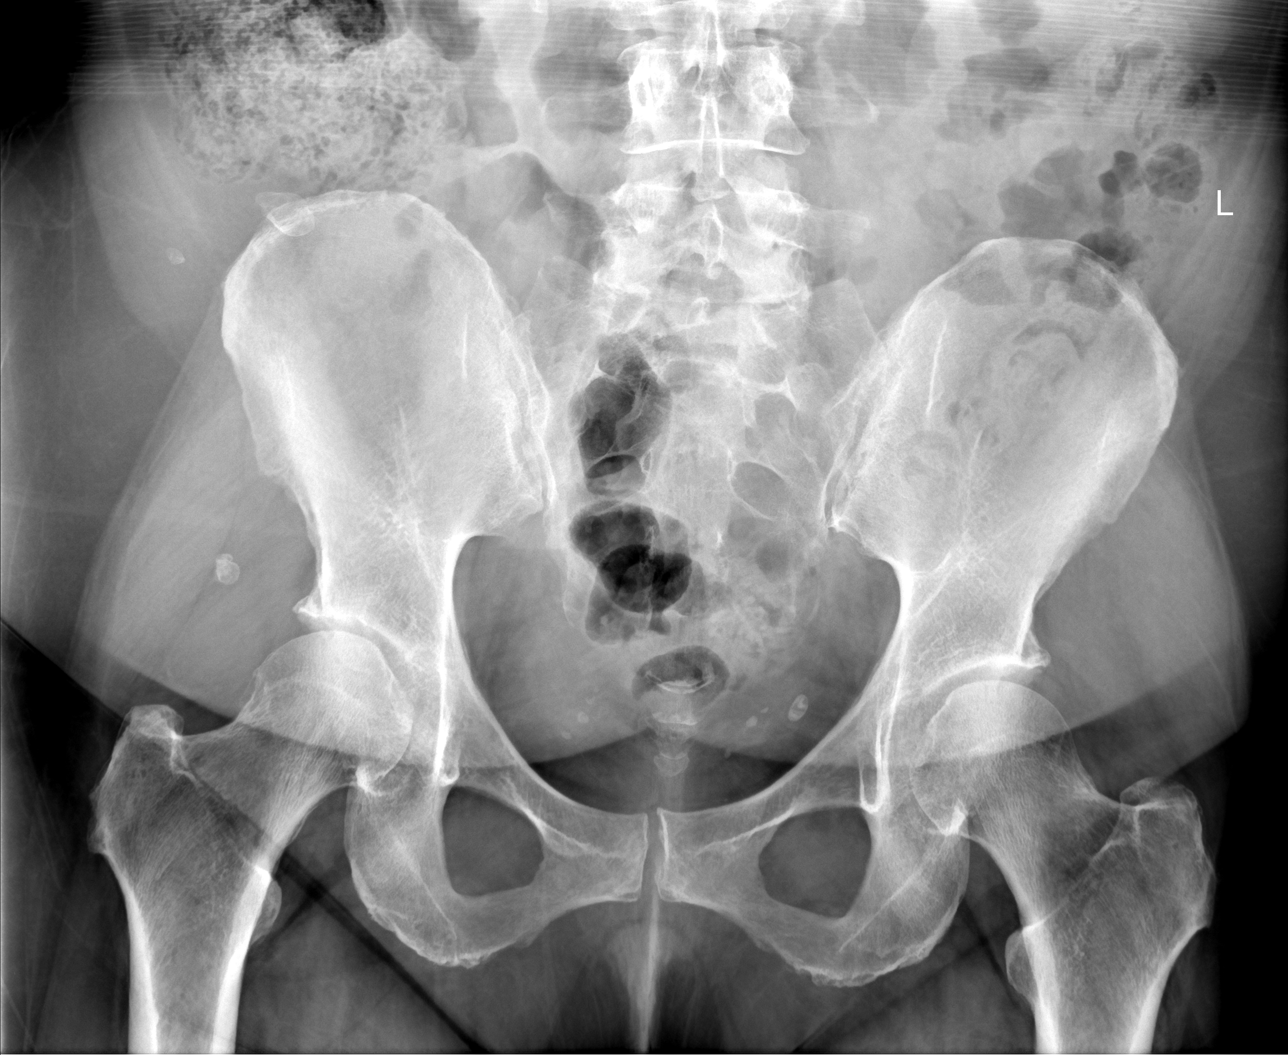

[hip ap]
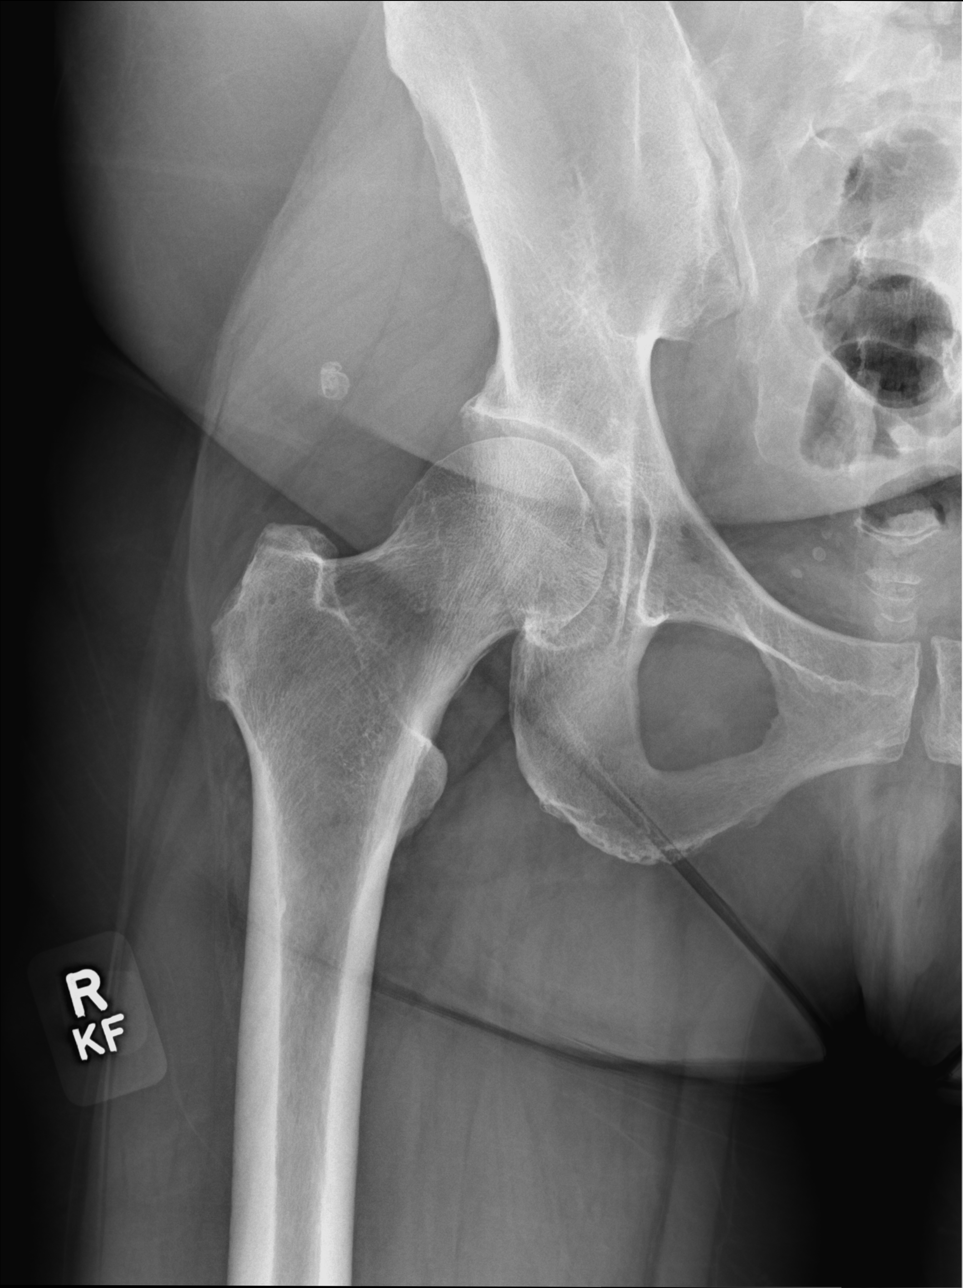

[hip frog lat]
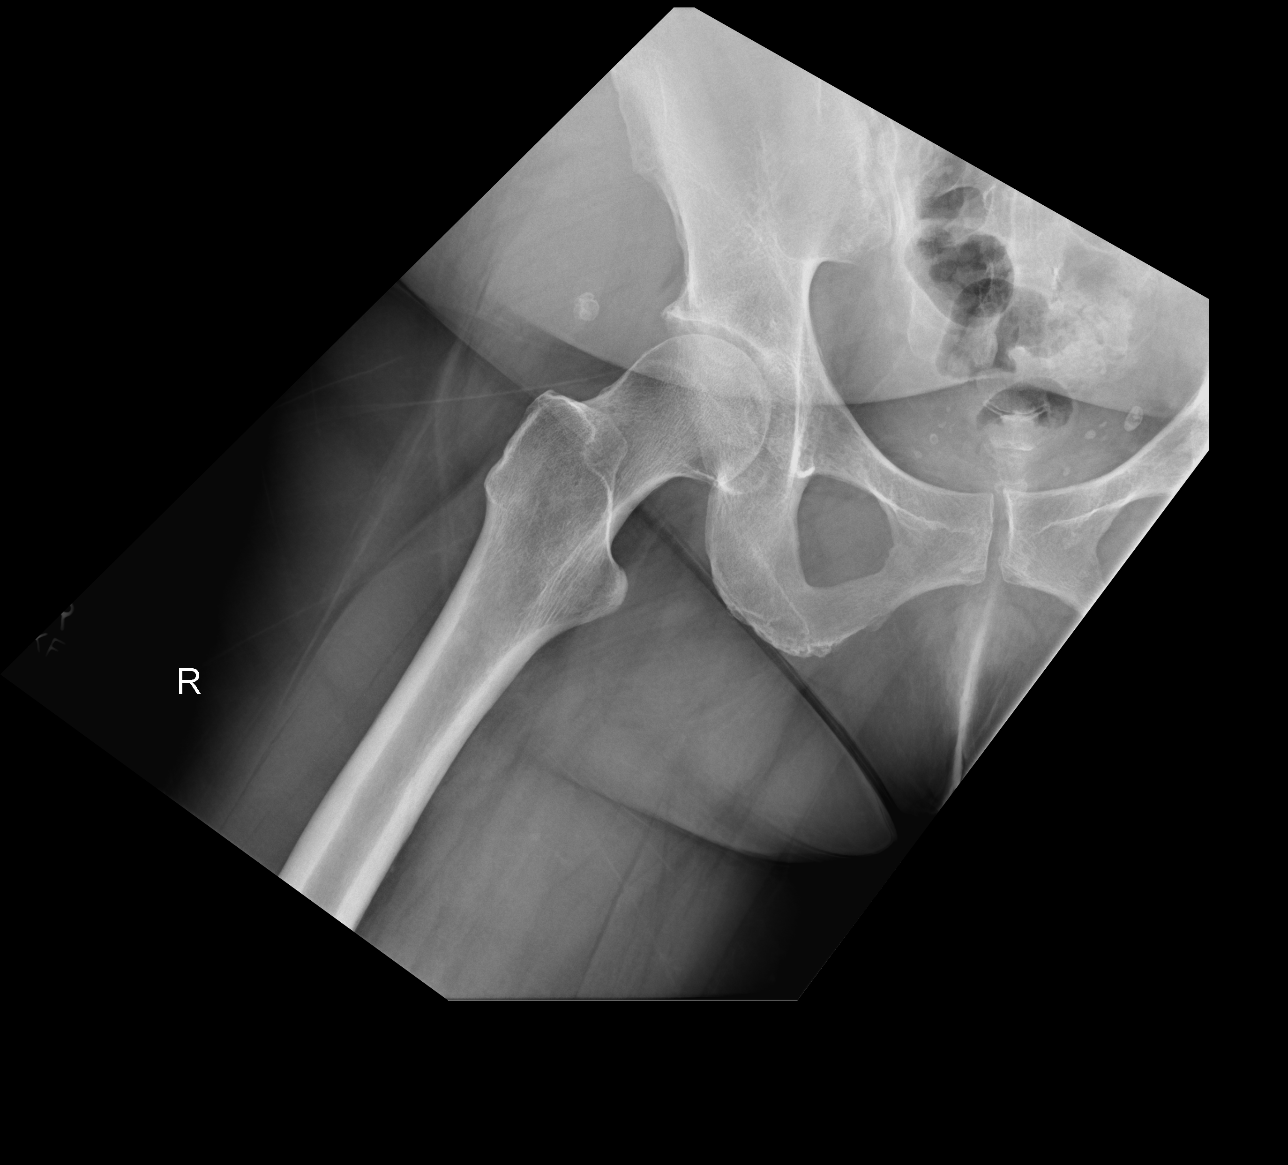

[3 of 3 positions shown; findings below may reference images not displayed]

FINDINGS: BONES/JOINTS:  NO acute fractures involving the pelvic bones.

NO acute fractures involving the hips.

Mild acetabular osteophytes and degenerative changes.

SOFT TISSUES:  NO radiopaque foreign body.
IMPRESSION: - NO acute fractures.

-Degenerative changes as described.

Tech Notes:

rt hip pain, heard a pop and unable to bear wt,

## 2021-09-20 ENCOUNTER — Encounter: Admit: 2021-09-20 | Discharge: 2021-09-20 | Payer: No Typology Code available for payment source

## 2021-09-20 NOTE — Telephone Encounter
Pt called stating that she had to go to [facility] last evening due to a worsening of her right hip and groin pain. States she felt a big pull or pop in the groin or hip area, and couldn't walk. She states they did x-rays and she was told there was no acute injury (This RN requested films and reports). Pt states she is trying to get into local [employer] for water aerobics, but feels something else is probably going to need to be done. She states her PCP, Dr Herschell Dimes, is currently prescribing medications to help with her pain.    Pt was seen by Dr Suan Halter on 08/30/2021, at which time he said:    IMPRESSION:    1. 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  2. Mild spinal canal stenosis greatest at L4-5 than L3-4.  3. Bilateral acetabular dysplasia hips asymptomatic.  4. 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  ?  RECOMMENDATIONS:    1. I have recommended Jakerra be instructed on thoracolumbar core strengthening exercises to be performed on a daily basis.  I would highly recommend she also perform water aerob[place] [facility] a [facility] near [place], [place] to accentuate her core strengthening.  2. I have also recommended Eber Jones diet to a more ideal body weight.  3. I have told Dewey we will obtain the results of previous pain management treatment from Dr. Danelle Berry and Para March.  I can reach her at [phone] to make further recommendations but I certainly do not believe surgical intervention is indicated.  ?  (EXB:[id number])  ?  09/12/21:  Dr Danelle Berry records:L4-5 ESI's on 08/07/19 & 09/22/19; MRI Right LE= Mild arthrosis & chondromalacia R Hip & mild tendinosis of gluteus minimus and medius. EMG by Dr. Ernst Spell on 08/14/21 consistent with pror history of CTS with no evidence of recurrent median neuropathy on either side.  Chronic findings in the bilateral upper extremities which may reflect old bilateral cervical radiculopathy, but can also be seen in cervical spinal stenosis. I have requested Ms. Kronberg reach out to Dr. Para March and obtain the exact type of injection and date she received ib 10/4.    Pt was advised we are waiting on specifics from Dr Lianne Bushy office, and have requested her Imaging from 09/19/2021 [facility] visit, and that we will be back in contact with her regarding Dr Gean Birchwood recommendations.

## 2021-09-21 ENCOUNTER — Encounter: Admit: 2021-09-21 | Discharge: 2021-09-21 | Payer: No Typology Code available for payment source

## 2021-09-28 ENCOUNTER — Encounter: Admit: 2021-09-28 | Discharge: 2021-09-28 | Payer: No Typology Code available for payment source

## 2021-09-28 ENCOUNTER — Ambulatory Visit: Admit: 2021-09-28 | Discharge: 2021-09-28 | Payer: No Typology Code available for payment source

## 2021-09-28 DIAGNOSIS — F419 Anxiety disorder, unspecified: Secondary | ICD-10-CM

## 2021-09-28 DIAGNOSIS — R519 Generalized headaches: Secondary | ICD-10-CM

## 2021-09-28 DIAGNOSIS — IMO0002 Ulcer: Secondary | ICD-10-CM

## 2021-09-28 DIAGNOSIS — M5136 Other intervertebral disc degeneration, lumbar region: Secondary | ICD-10-CM

## 2021-09-28 DIAGNOSIS — I10 Essential (primary) hypertension: Secondary | ICD-10-CM

## 2021-09-28 DIAGNOSIS — R011 Cardiac murmur, unspecified: Secondary | ICD-10-CM

## 2021-09-28 DIAGNOSIS — F32A Depression: Secondary | ICD-10-CM

## 2021-09-28 DIAGNOSIS — M5417 Radiculopathy, lumbosacral region: Secondary | ICD-10-CM

## 2021-09-28 DIAGNOSIS — K7460 Unspecified cirrhosis of liver: Secondary | ICD-10-CM

## 2021-09-28 DIAGNOSIS — M4800 Spinal stenosis, site unspecified: Secondary | ICD-10-CM

## 2021-09-28 DIAGNOSIS — M2550 Pain in unspecified joint: Secondary | ICD-10-CM

## 2021-09-28 NOTE — Patient Instructions
It was nice to see you today. Thank you for choosing to visit our clinic.       Your time is important and if you had to wait today, we do apologize. Our goal is to run exactly on time; however, on occasion, we get behind in clinic due to unexpected patient issues. Thank you for your patience.    General Instructions:  How to reach me: Please send a MyChart message to the [facility] or leave a voicemail for my nurse, Kathy at [phone]  How to get a medication refill: Please use the MyChart Refill request or contact your pharmacy directly to request medication refills. We do not do same day refills on controlled substances.  How to receive your test results: If you have signed up for MyChart, you will receive your test results and messages from me this way. Otherwise, you will get a phone call or letter. If you are expecting results and have not heard from my office within 2 weeks of your testing, please send a MyChart message or call my office.  Scheduling: Our scheduling phone number is [phone].  Appointment Reminders on your cell phone: Communication preferences can be managed in MyChart to ensure you receive important appointment notifications.  Support for many chronic illnesses is available through [facility]: [email or web address] or [phone].  For questions on nights, weekends or holidays, call the Operator at [phone], and ask for the doctor on call for Anesthesia Pain.      Again, thank you for coming in today.    _________________________________________________________________________________________________________

## 2021-10-06 ENCOUNTER — Encounter: Admit: 2021-10-06 | Discharge: 2021-10-06 | Payer: No Typology Code available for payment source

## 2021-10-06 DIAGNOSIS — M47816 Spondylosis without myelopathy or radiculopathy, lumbar region: Secondary | ICD-10-CM

## 2021-10-06 DIAGNOSIS — M5136 Other intervertebral disc degeneration, lumbar region: Secondary | ICD-10-CM

## 2021-10-06 DIAGNOSIS — M5417 Radiculopathy, lumbosacral region: Secondary | ICD-10-CM

## 2021-10-30 IMAGING — MR C-spine^Routine
5 series · 41 of 48 positions shown · non-contrast
Comparison: none

[Series 2: T2 · sagittal · 3.0mm · 0.54mm/px · 8 of 13 slices shown]
[im 1/13]
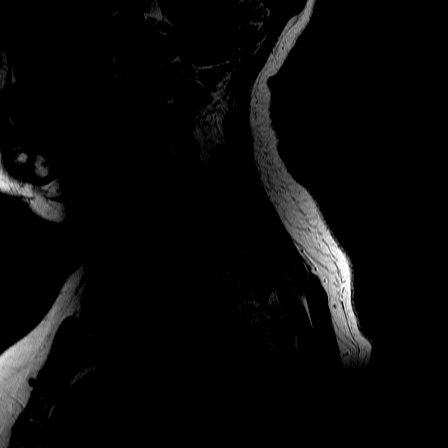
[im 2/13]
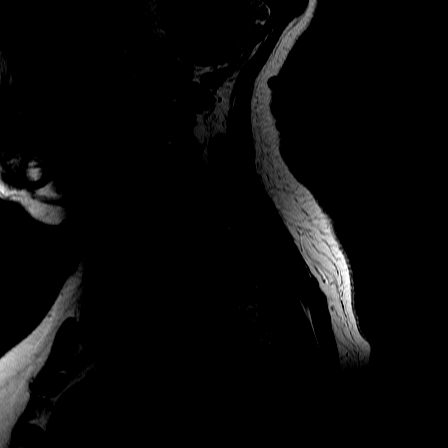
[im 4/13]
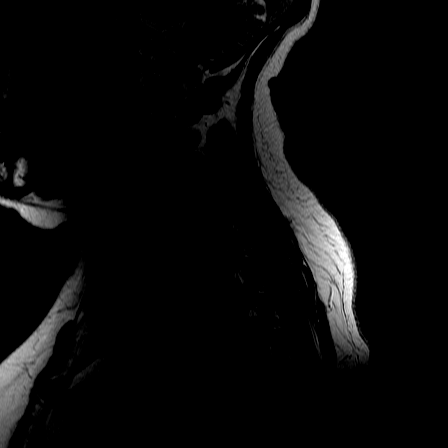
[im 6/13]
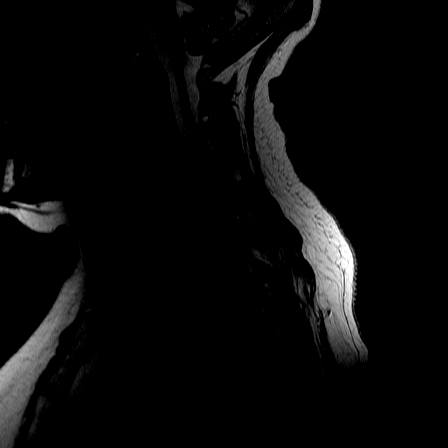
[im 7/13]
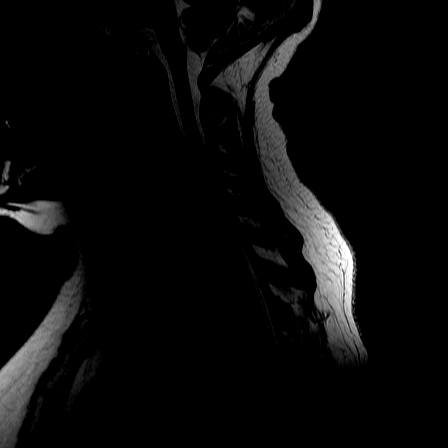
[im 9/13]
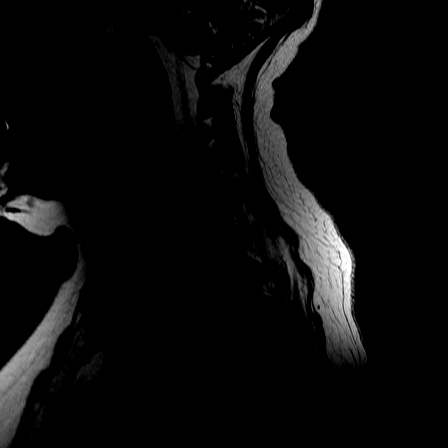
[im 11/13]
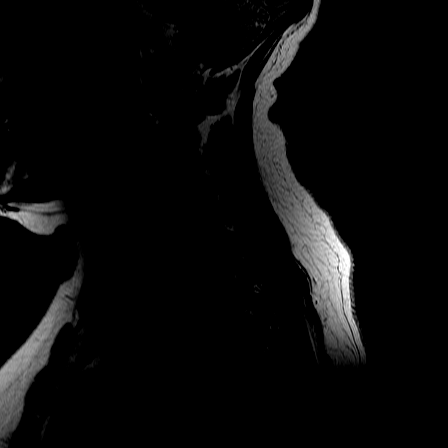
[im 13/13]
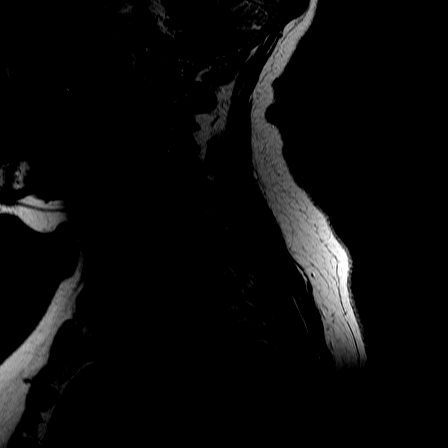

[Series 3: T1 · sagittal · 3.0mm · 0.75mm/px · 7 of 13 slices shown (1 of 2)]
[im 1/13]
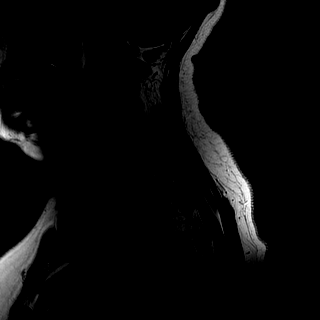
[im 3/13]
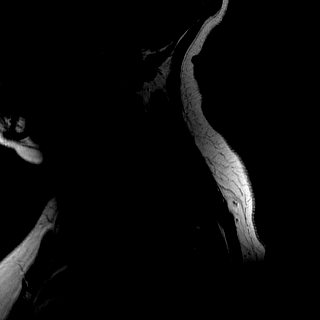
[im 5/13]
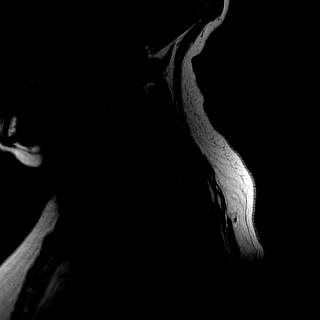
[im 7/13]
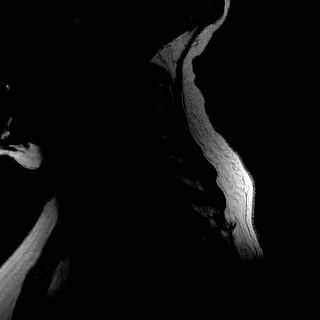
[im 9/13]
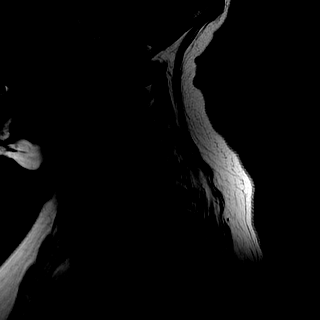
[im 11/13]
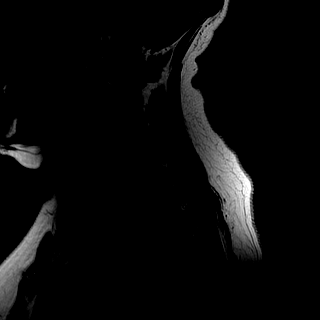
[im 13/13]
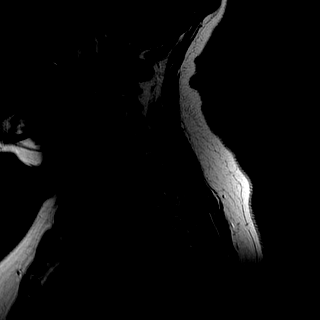

[Series 4: STIR · sagittal · 3.0mm · 0.94mm/px · 7 of 13 slices shown]
[im 1/13]
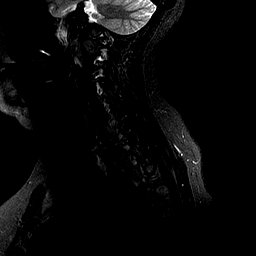
[im 3/13]
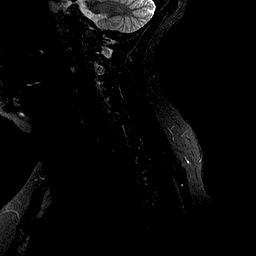
[im 5/13]
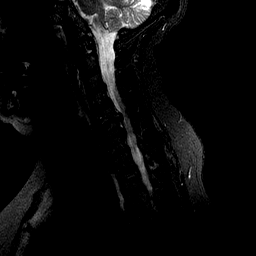
[im 7/13]
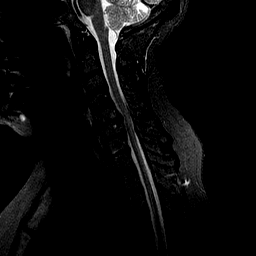
[im 9/13]
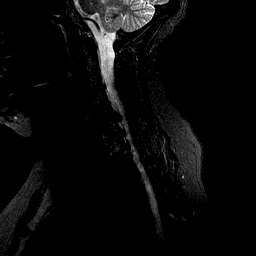
[im 11/13]
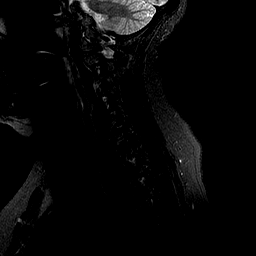
[im 13/13]
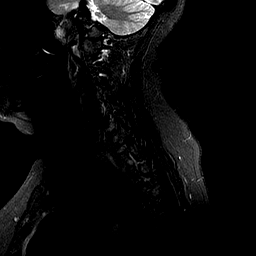

[Series 5: provider echo axial · axial · 3.0mm · 0.39mm/px · z∈[-25,+62]mm · 8 of 24 slices shown]
[im 1/24]
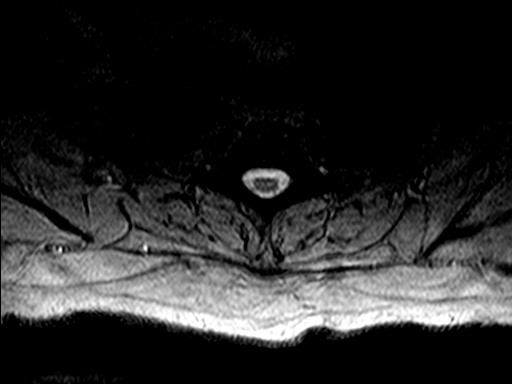
[im 4/24]
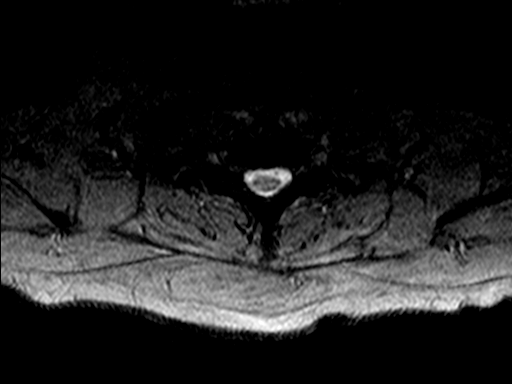
[im 8/24]
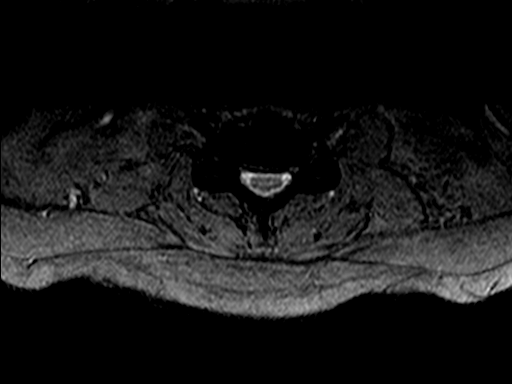
[im 10/24]
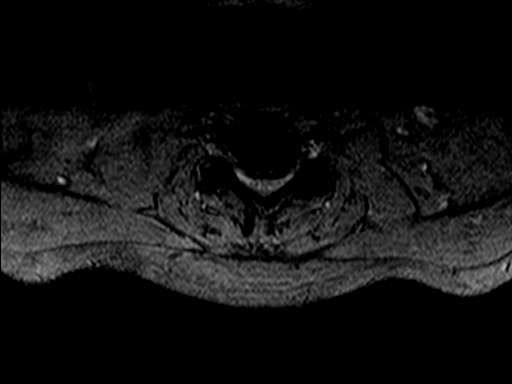
[im 14/24]
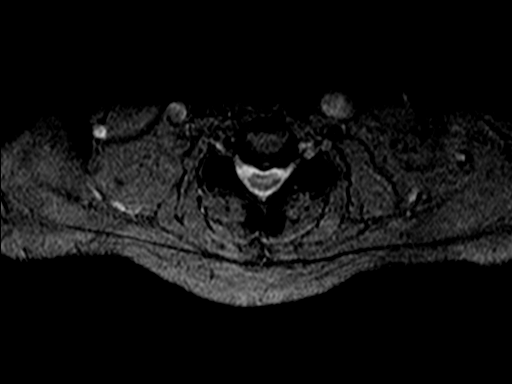
[im 16/24]
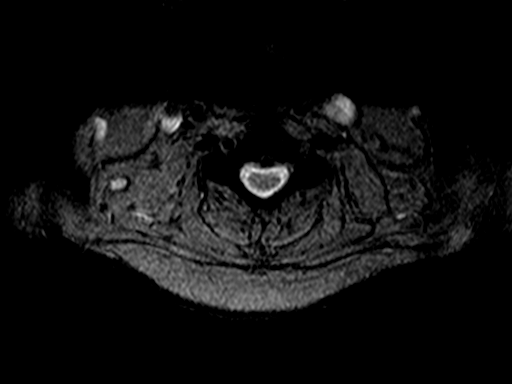
[im 20/24]
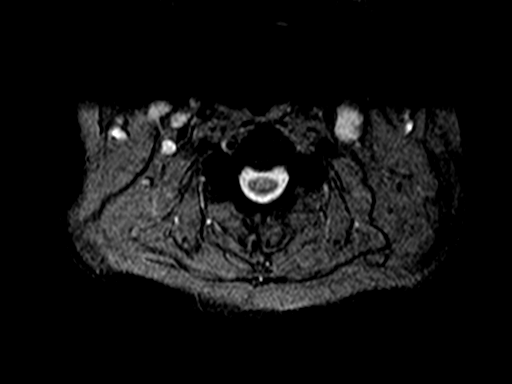
[im 24/24]
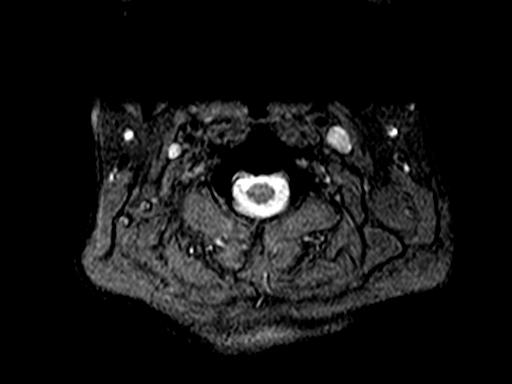

[Series 6: T1 · axial · 3.0mm · 0.78mm/px · z∈[-25,+62]mm · 11 of 24 slices shown (2 of 2)]
[im 1/24]
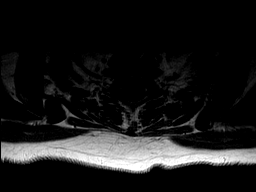
[im 2/24]
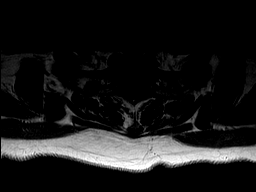
[im 4/24]
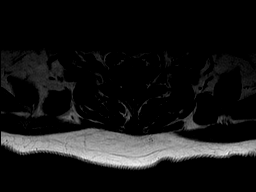
[im 6/24]
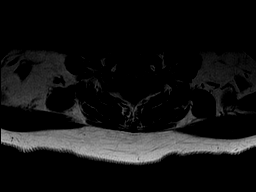
[im 8/24]
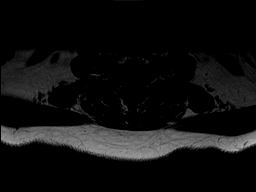
[im 10/24]
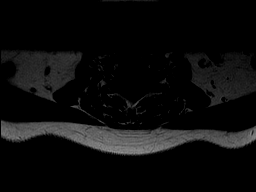
[im 12/24]
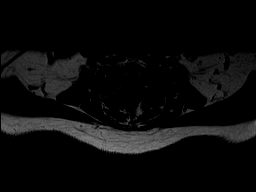
[im 14/24]
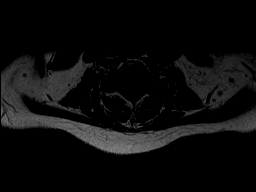
[im 16/24]
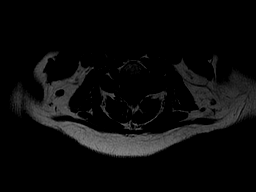
[im 20/24]
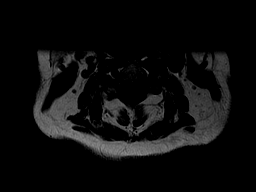
[im 24/24]
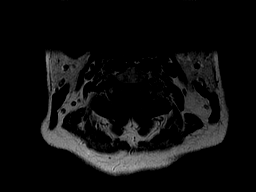

[41 of 48 positions shown; findings below may reference images not displayed]

EXAM

MR cervical spine wo con

INDICATION

Pain

TECHNIQUE

Multiplanar, multisequence imaging of the cervical spine without contrast.

COMPARISONS

None available at the time of dictation.

FINDINGS

ANATOMY: Normal cervical lordosis. No spondylolisthesis.

VERTEBRAL BODIES: No endplate compression fracture. No significant endplate degenerative change.

C2-C3: Unremarkable.

C3-C4: A concentric disc bulge is present.

C4-C5: Unremarkable

C5-C6: Decreased intervertebral disc space within the disc osteophyte complex. Mild bilateral
neural foraminal stenosis. Moderate spinal canal stenosis with effacement of the cord. No abnormal
cord signal seen.

C6-C7: Decreased intervertebral disc space with disc osteophyte complex. Minimal bilateral neural
foraminal stenosis. Mild spinal canal stenosis.

C7-T1: Unremarkable.

OTHER: No abnormality of the visualized abdomen/pelvis.

IMPRESSION

Degenerative disc disease resulting in mild neural foraminal stenosis at the levels of C5-C6 and
C6-C7 and up to moderate spinal canal stenosis at the level of C5-C6..

Tech Notes:

PAIN AND NUMBNESS TO BILATERAL HANDS WITH SWELLING, WORSE SINCE CARPAL TUNNEL SX March 2020.  RG

## 2021-11-24 ENCOUNTER — Encounter: Admit: 2021-11-24 | Discharge: 2021-11-24 | Payer: No Typology Code available for payment source

## 2021-11-24 ENCOUNTER — Ambulatory Visit: Admit: 2021-11-24 | Discharge: 2021-11-24 | Payer: No Typology Code available for payment source

## 2021-11-24 DIAGNOSIS — M5417 Radiculopathy, lumbosacral region: Secondary | ICD-10-CM

## 2021-11-24 DIAGNOSIS — F32A Depression: Secondary | ICD-10-CM

## 2021-11-24 DIAGNOSIS — K7460 Unspecified cirrhosis of liver: Secondary | ICD-10-CM

## 2021-11-24 DIAGNOSIS — M4800 Spinal stenosis, site unspecified: Secondary | ICD-10-CM

## 2021-11-24 DIAGNOSIS — R011 Cardiac murmur, unspecified: Secondary | ICD-10-CM

## 2021-11-24 DIAGNOSIS — F419 Anxiety disorder, unspecified: Secondary | ICD-10-CM

## 2021-11-24 DIAGNOSIS — IMO0002 Ulcer: Secondary | ICD-10-CM

## 2021-11-24 DIAGNOSIS — M5136 Other intervertebral disc degeneration, lumbar region: Secondary | ICD-10-CM

## 2021-11-24 DIAGNOSIS — M2550 Pain in unspecified joint: Secondary | ICD-10-CM

## 2021-11-24 DIAGNOSIS — I10 Essential (primary) hypertension: Secondary | ICD-10-CM

## 2021-11-24 DIAGNOSIS — R519 Generalized headaches: Secondary | ICD-10-CM

## 2021-11-24 MED ORDER — TRIAMCINOLONE ACETONIDE 40 MG/ML IJ SUSP
80 mg | Freq: Once | EPIDURAL | 0 refills | Status: CP
Start: 2021-11-24 — End: ?

## 2021-11-24 MED ORDER — LIDOCAINE (PF) 10 MG/ML (1 %) IJ SOLN
2 mL | Freq: Once | INTRAMUSCULAR | 0 refills | Status: CP
Start: 2021-11-24 — End: ?

## 2021-11-24 MED ORDER — IOHEXOL 240 MG IODINE/ML IV SOLN
1 mL | Freq: Once | EPIDURAL | 0 refills | Status: CP
Start: 2021-11-24 — End: ?

## 2021-11-24 NOTE — Progress Notes
SPINE CENTER  INTERVENTIONAL PAIN PROCEDURE HISTORY AND PHYSICAL    Chief Complaint: Pain    HISTORY OF PRESENT ILLNESS:  Sandra Leach presents today for interventional treatment of her pain. She denies any fevers, chills, infection, or current use of contraindicated anticoagulants. Risks of the procedure were discussed including but not limited to bleeding, infection, damage to surrounding structures and reaction to medications. She reports understanding and has elected to proceed with the procedure.    Medical History:   Diagnosis Date   ? Anxiety 12/18/1998   ? Cirrhosis (HCC) 02/23/2019   ? 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 Depression 12/18/1998   ? Essential hypertension 12/18/2016   ? Generalized headaches     occasionally   ? Heart murmur 12/18/2017   ? Joint pain 12/19/2015   ? Spinal stenosis 06/22/2019   ? Ulcer 02/23/2019       Surgical History:   Procedure Laterality Date   ? HX ARTHROSCOPIC SURGERY  12/18/2000   ? HX HYSTERECTOMY  12/19/1999   ? HX JOINT REPLACEMENT  10/04/2001   ? KNEE SURGERY  10/04/2001       family history includes Alcohol liver disease in her father; Arthritis in her mother; Back pain in her father; Diabetes in her paternal grandmother; Joint Pain in her father; Stroke in her father and paternal grandmother.    Social History     Socioeconomic History   ? Marital status: Married   Tobacco Use   ? Smoking status: Former     Packs/day: 0.00     Years: 10.00     Pack years: 0.00     Types: Cigarettes     Quit date: 02/23/2019     Years since quitting: 2.7   ? Smokeless tobacco: Never   ? Tobacco comments:     Was not a constant smoker   Substance and Sexual Activity   ? Alcohol use: Not Currently   ? Drug use: Never   ? Sexual activity: Not Currently     Partners: Male     Birth control/protection: Pill       Allergies   Allergen Reactions   ? Morphine ANXIETY, ITCHING and REDNESS     Itchy, increased anxiety          There were no vitals filed for this visit.    REVIEW OF SYSTEMS: 10 point ROS obtained and negative except pain    PHYSICAL EXAM:  General: Alert, cooperative, no distress  Head: Normocephalic, atraumatic  Lungs: Unlabored respirations  Heart: Well perfused  Abdomen: Non-distended  Musculoskeletal: Moves all extremities  Neurological: Grossly intact      IMPRESSION:    1. DDD (degenerative disc disease), lumbar    2. Lumbosacral radiculopathy         PLAN: Lumbar Transforaminal Steroid Injection bilateral L4-5 TFESIs

## 2021-11-24 NOTE — Procedures
Attending Surgeon: Helayne Seminole, MD    Anesthesia: Local    Pre-Procedure Diagnosis:   1. DDD (degenerative disc disease), lumbar    2. Lumbosacral radiculopathy        Post-Procedure Diagnosis:   1. DDD (degenerative disc disease), lumbar    2. Lumbosacral radiculopathy             [place] [facility] SPINE INJECT SNRB/TFESI LUMBAR/SACRAL  Procedure: transforaminal epidural    Laterality: bilateral    Location: lumbar -  L4-5      Consent:   Consent obtained: written  Consent given by: patient  Risks discussed: bleeding, infection, no change or worsening in pain, reaction to medication and nerve damage  Alternatives discussed: alternative treatment, no treatment and delayed treatment  Discussed with patient the purpose of the treatment/procedure, other ways of treating my condition, including no treatment/ procedure and the risks and benefits of the alternatives. Patient has decided to proceed with treatment/procedure.        Universal Protocol:  Relevant documents: relevant documents present and verified  Test results: test results available and properly labeled  Imaging studies: imaging studies available  Required items: required blood products, implants, devices, and special equipment available  Site marked: the operative site was marked  Patient identity confirmed: Patient identify confirmed verbally with patient.        Time out: Immediately prior to procedure a time out was called to verify the correct patient, procedure, equipment, support staff and site/side marked as required      Procedures Details:   Indications: pain   Prep: chlorhexidine  Patient position: prone  Estimated Blood Loss: minimal  Specimens: none  Number of Levels: 1  Approach: paramedian  Guidance: fluoroscopy  Contrast: Procedure confirmed with contrast under live fluoroscopy.  Needle and Epidural Catheter: quincke  Needle size: 25 G  Injection procedure: Incremental injection and Negative aspiration for blood  Amount Injected:   L4-5: 2mL  Patient tolerance: Patient tolerated the procedure well with no immediate complications. Pressure was applied, and hemostasis was accomplished.  Outcome: Pain improved  Comments: INTERVENTIONAL PAIN MANAGEMENT PROCEDURE REPORT    Lumbar Transforaminal Epidural Injection    Date of Service: 11/24/2021    Procedure Title(s):    1. Bilateral L4-L5 transforaminal epidural injection   2. Intraoperative fluoroscopy     Attending Surgeon: Helayne Seminole, MD    Anesthesia: Local    Indications: Sandra Leach is a 60 y.o. female with a diagnosis of pain. The patient's history and physical exam were reviewed. The risks, benefits and alternatives to the procedure were discussed, and all questions were answered to the patient's satisfaction. The patient agreed to proceed, and written informed consent was obtained.     Procedure in Detail:    The patient was brought into the procedure room and placed in the prone position on the fluoroscopy table. Standard monitors were placed, and vital signs were observed throughout the procedure. The area of the lumbar spine was prepped with 2% chlorhexidine and draped in a sterile manner.     The L4 vertebral body was identified with AP fluoroscopy. An oblique view to the right was obtained to better visualize the inferior junction of the pedicle and transverse process. The 6 o'clock position of the pedicle was marked and identified. The skin and subcutaneous tissues in the area were anesthetized with 1% lidocaine. A 25-gauge needle was directed toward the targeted point under fluoroscopy until bone was contacted. The needle was then  walked inferiorly until the neural foramen was entered. A lateral fluoroscopic view was then used to place the needle tip at the posterior and cephalad position in the foramen.     Negative aspiration was confirmed, and 1ml of contrast was injected at each level. Appropriate neurograms were observed under live AP fluoroscopy with no noted vascular or intrathecal uptake. Then, after negative aspiration, a solution consisting of 1 mL of 1% lidocaine and 40mg  kenalog was easily injected at each level. The needle was removed with a 1% lidocaine flush. The patient's back was cleaned and a bandage was placed over the needle insertion points.    The same procedure was performed on the opposite side? Yes    Disposition: The patient tolerated the procedure well, and there were no apparent complications. Vital signs remained stable throughout the procedure. The patient was taken to the recovery area where discharge instructions for the procedure were given.     Estimated Blood Loss: Minimal    Specimens: None    Complications: None          Administrations This Visit     iohexoL (OMNIPAQUE-240) 240 mg/mL injection 1 mL     Admin Date  11/24/2021 Action  Given Dose  1 mL Route  Epidural Administered By  Helayne Seminole, MD          lidocaine PF 1% (10 mg/mL) injection 2 mL     Admin Date  11/24/2021 Action  Given Dose  2 mL Route  Injection Administered By  Helayne Seminole, MD          triamcinolone acetonide [facility]) injection 80 mg     Admin Date  11/24/2021 Action  Given Dose  80 mg Route  Epidural Administered By  Helayne Seminole, MD              Estimated blood loss: none or minimal  Specimens: none  Patient tolerated the procedure well with no immediate complications. Pressure was applied, and hemostasis was accomplished.

## 2021-11-24 NOTE — Discharge Instructions - Supplementary Instructions
GENERAL POST PROCEDURE INSTRUCTIONS  Physician: _________________________________  Procedure Completed Today:  Joint Injection (hip, knee, shoulder)  Cervical Epidural Steroid Injection  Cervical Transforaminal Steroid Injection  Trigger Point Injection  Caudal Epidural Steroid Injection  Pudendal Nerve Block  Other _____________________ Thoracic Epidural Steroid Injection  Lumbar Epidural Steroid Injection  Lumbar Transforaminal Steroid Injection  Facet Joint Injection  Celiac Nerve Block  Sacrococcygeal  Sacroiliac Joint Injection   Important information following your procedure today:  You may drive today     If you had sedation, you may NOT drive today  Rest at home for the next 6 hours.  You may then begin to resume your normal activities.  DO NOT drive any vehicle, operate any power tools, drink alcohol, make any major decisions, or sign any legal documents for the next 12 hours.  Pain relief may not be immediate. It is possible you may even experience an increase in pain during the first 24-48 hours followed by a gradual decrease of your pain.  Though the procedure is generally safe, and complications are rare, we do ask that you be aware of any of the following:  Any swelling, persistent redness, new bleeding or drainage from the site of the injection.  You should not experience a severe headache.  You should not run a fever over 101oF.  New onset of sharp, severe back and or neck pain.  New onset of upper or lower extremity numbness or weakness.  New difficulty controlling bowel or bladder function after injection.  New shortness of breath.  ** If any of these occur, please call to report this occurrence to a nurse at [phone]. If you are calling after 4:00 p.m. or on weekends or holidays, please call [phone] and ask to have the resident physician on call for the physician paged or go to your local emergency room.  You may experience soreness at the injection site. Ice can be applied at 20-minute intervals for the first 24 hours. The following day you may alternate ice with heat if you are experiencing muscle tightness, otherwise continue with ice. Ice works best at decreasing pain. Avoid application of direct heat, hot showers or hot tubs today.  Avoid strenuous activity today. You many resume your regular activities and exercise tomorrow.  Patients with diabetes may see an elevation in blood sugars for 7-10 days after the injection. It is important to pay close attention to your diet, check your blood sugars daily and report extreme elevations to the physician that manages your diabetes.  Patients taking daily blood thinners can resume their regular dose this evening.  It is important that you take all medications ordered by your pain physician. Taking medications as ordered is an important part of your pain care plan. If you cannot continue the medication plan, please notify the physician.    Possible side effects to steroids that may occur:  Flushing or redness of the face  Irritability  Fluid retention  Change in women's menses  Minor headache    If you are unable to keep your upcoming appointment, please notify the [facility] scheduler at [phone] at least 24 hours in advance. If you have questions for the surgery center, call [facility] at [phone].

## 2021-11-27 ENCOUNTER — Encounter: Admit: 2021-11-27 | Discharge: 2021-11-27 | Payer: No Typology Code available for payment source

## 2021-12-26 NOTE — Patient Instructions
It was nice to see you today.  Thank you for choosing to visit our clinic.  Your time is important, and if you had to wait today, we do apologize.  Our goal is to run exactly on time.  However, on occasion, we get behind in clinic due to unexpected patient issues.  Thank you for your patience.    General Instructions:  Scheduling:  Our scheduling phone number is [phone].  Appointment Reminders on your cell phone:  Communication preferences can be managed in MyChart to ensure you receive important appointment notifications  How to reach our office:  Please send a MyChart message to the [facility]ine Center (directed to Dr. Cordell) or leave a voicemail for the nurse, Dustin Burrill, at [phone].  Support for many chronic illnesses is available through [facility] at [email or web address] or [phone].    For help with MyChart:  please call [phone].    For more information on spinal conditions:  please visit [email or web address]     Again, thank you for coming in today.

## 2021-12-27 ENCOUNTER — Ambulatory Visit: Admit: 2021-12-27 | Discharge: 2021-12-27 | Payer: No Typology Code available for payment source

## 2021-12-27 ENCOUNTER — Encounter: Admit: 2021-12-27 | Discharge: 2021-12-27 | Payer: No Typology Code available for payment source

## 2021-12-27 DIAGNOSIS — I10 Essential (primary) hypertension: Secondary | ICD-10-CM

## 2021-12-27 DIAGNOSIS — R011 Cardiac murmur, unspecified: Secondary | ICD-10-CM

## 2021-12-27 DIAGNOSIS — M2550 Pain in unspecified joint: Secondary | ICD-10-CM

## 2021-12-27 DIAGNOSIS — R519 Generalized headaches: Secondary | ICD-10-CM

## 2021-12-27 DIAGNOSIS — M542 Cervicalgia: Secondary | ICD-10-CM

## 2021-12-27 DIAGNOSIS — M5136 Other intervertebral disc degeneration, lumbar region: Secondary | ICD-10-CM

## 2021-12-27 DIAGNOSIS — F32A Depression: Secondary | ICD-10-CM

## 2021-12-27 DIAGNOSIS — IMO0002 Ulcer: Secondary | ICD-10-CM

## 2021-12-27 DIAGNOSIS — F419 Anxiety disorder, unspecified: Secondary | ICD-10-CM

## 2021-12-27 DIAGNOSIS — M5412 Radiculopathy, cervical region: Secondary | ICD-10-CM

## 2021-12-27 DIAGNOSIS — K7460 Unspecified cirrhosis of liver: Secondary | ICD-10-CM

## 2021-12-27 DIAGNOSIS — M4800 Spinal stenosis, site unspecified: Secondary | ICD-10-CM

## 2021-12-27 NOTE — Progress Notes
[facility] CLINIC NOTE     Dictation on: 12/27/2021  3:24 PM by: Wyona Almas [[email or web address]]                Vitals:    12/27/21 1246   BP: 134/67   Pulse: 68   Temp: 36.5 C (97.7 F)   Resp: 18   SpO2: 98%   PainSc: Five   Weight: 88.9 kg (196 lb)   Height: 158.8 cm (5' 2.5")       Review of Systems    Total time 70 minutes.

## 2022-02-12 ENCOUNTER — Encounter: Admit: 2022-02-12 | Discharge: 2022-02-12 | Payer: No Typology Code available for payment source

## 2022-03-01 IMAGING — MG MM mammogram 3D screen bilat
8 series · 10 of 24 positions shown · non-contrast
Comparison: none

[R CC]
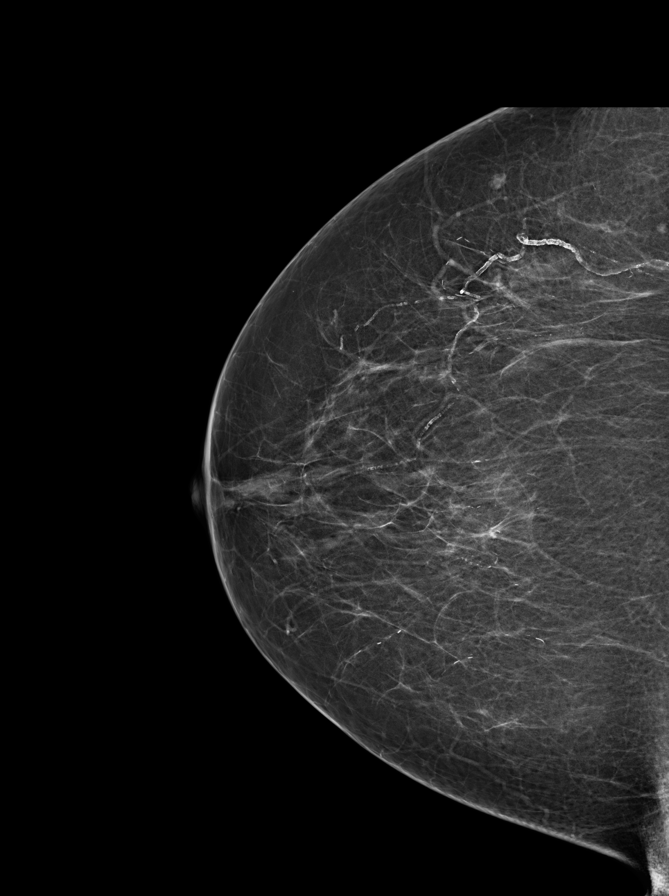

[Series 4: (PERSON_NAME)_TOMO R-CC PRIME, EMPIRE_C. tomo · 3 of 70 frames shown]
[frame 23/70]
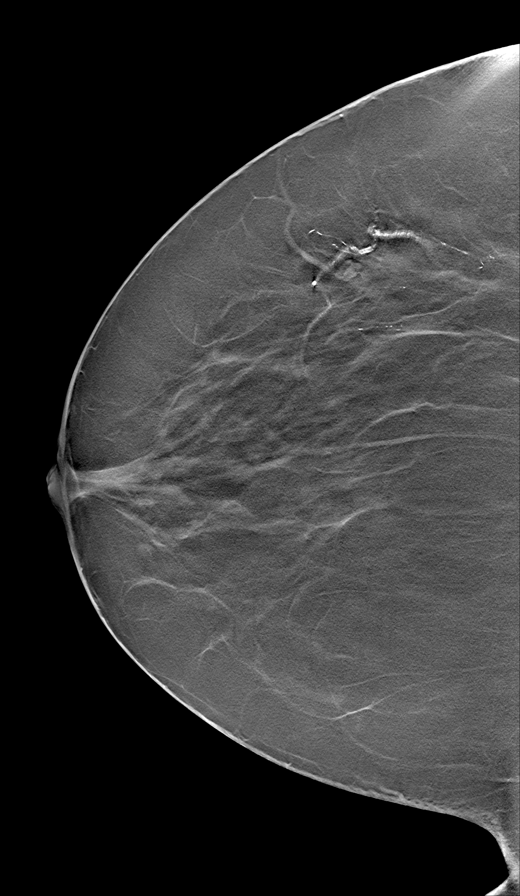
[frame 35/70]
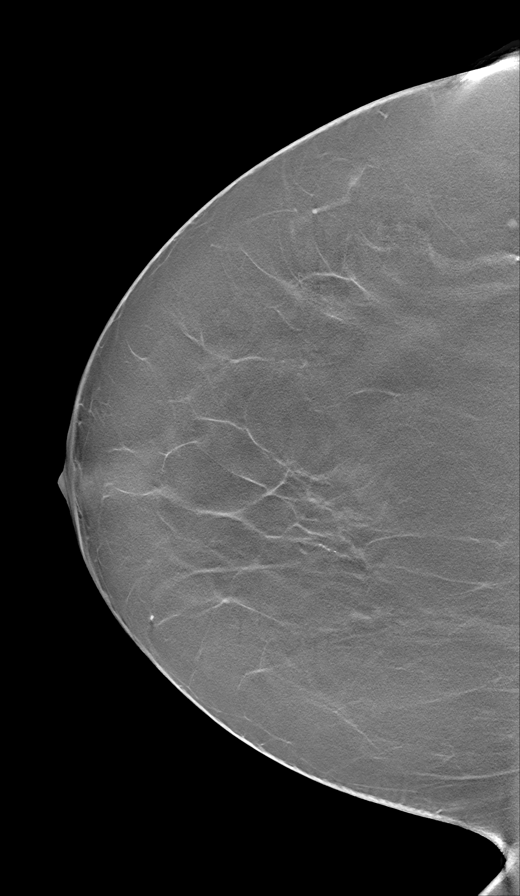
[frame 48/70]
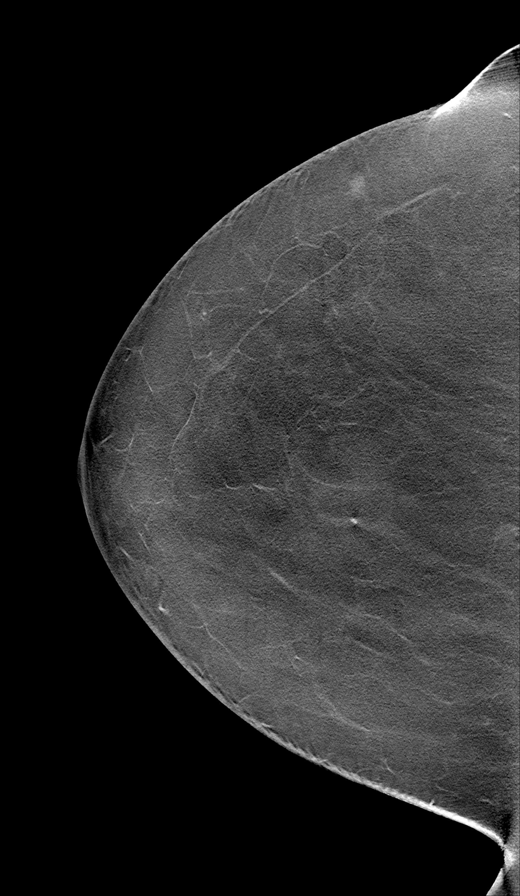

[L CC]
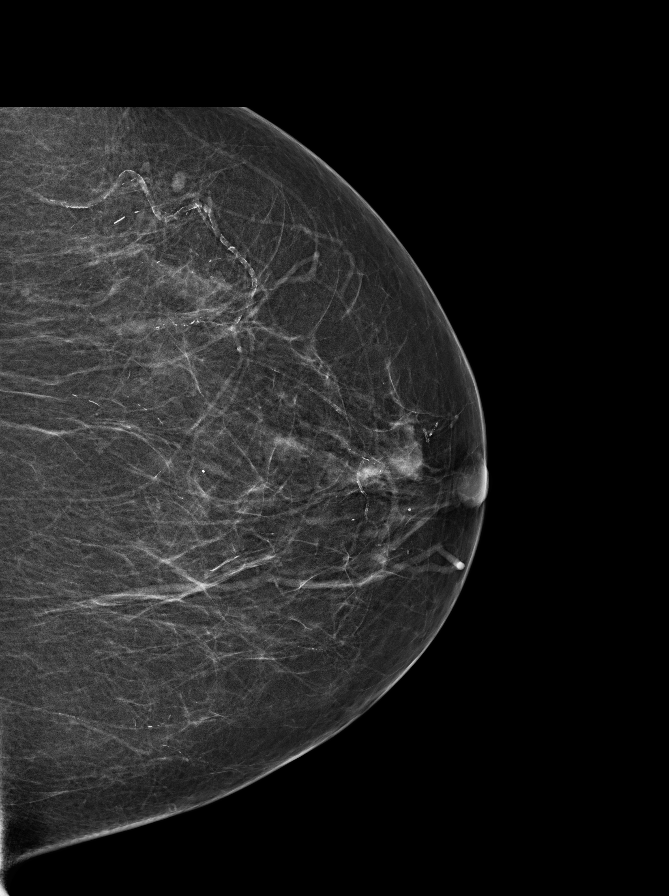

[[PERSON_NAME]_TOMO L-CC PRIME, EMPIRE_C. tomo · tomo slice 36/71.0]
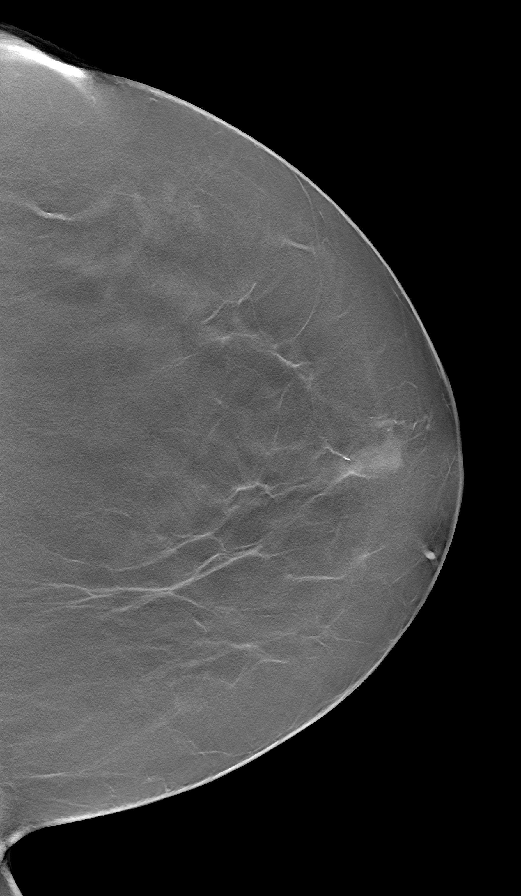

[R MLO]
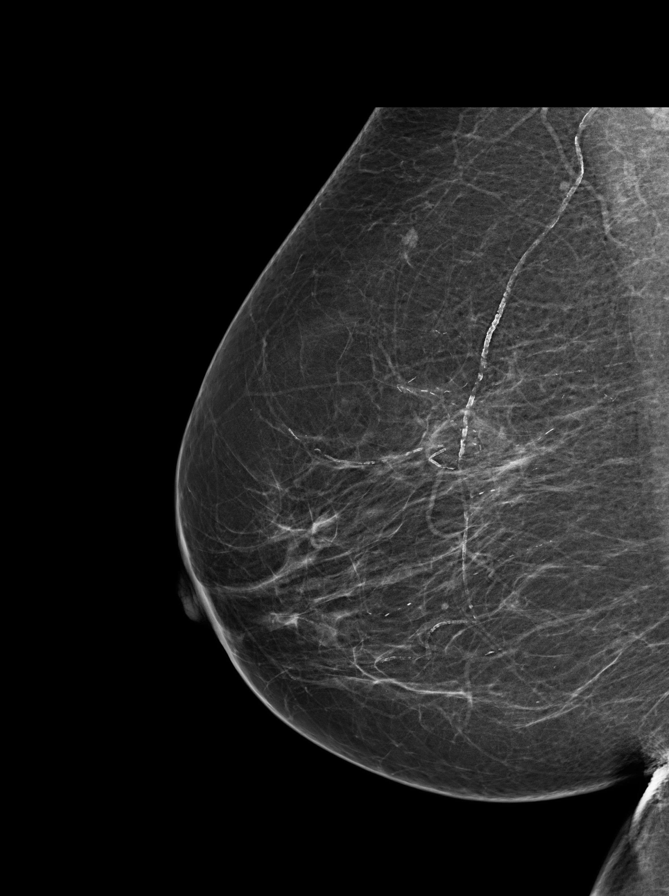

[[PERSON_NAME]_TOMO R-MLO PRIME, EMPIRE_C tomo · tomo slice 35/69.0]
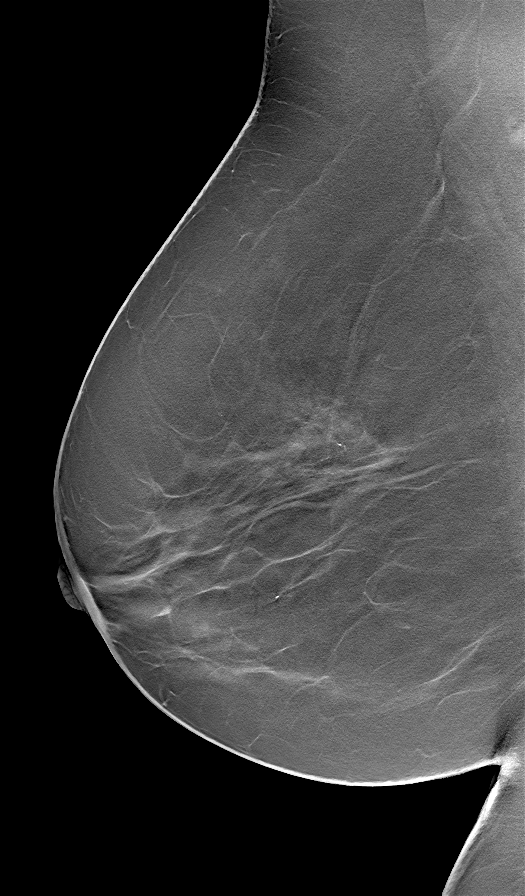

[L MLO]
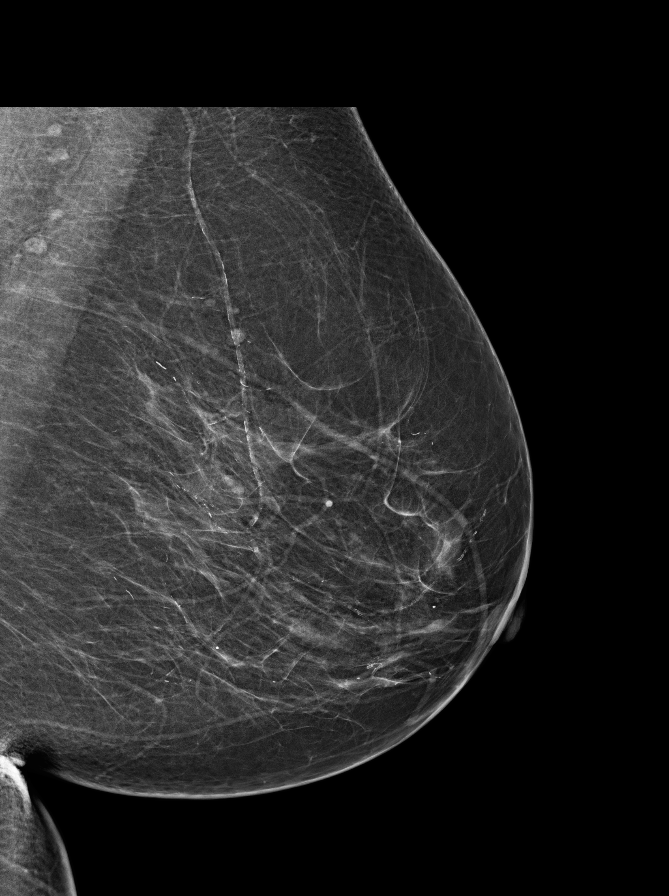

[[PERSON_NAME]_TOMO L-MLO PRIME, EMPIRE_C tomo · tomo slice 37/72.0]
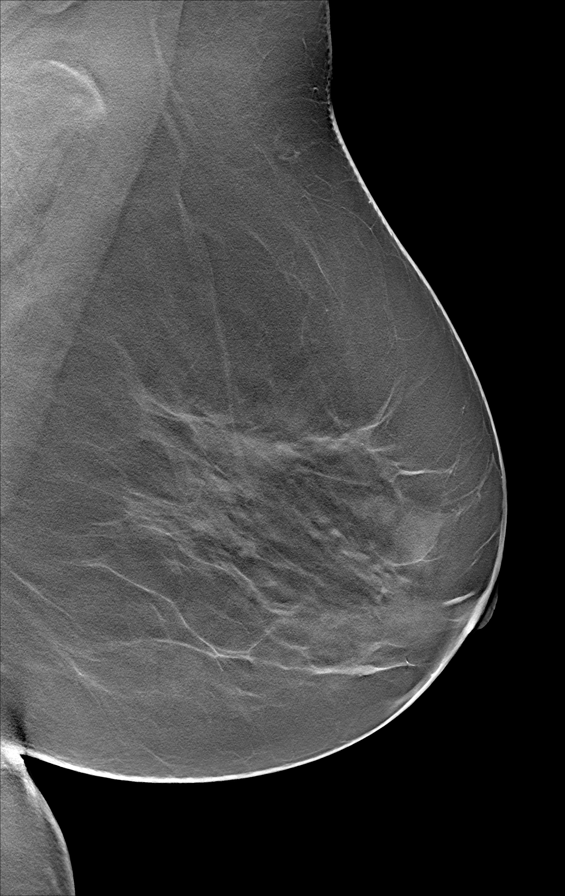

[10 of 24 positions shown; findings below may reference images not displayed]

EXAM

3D SCREENING MAMMOGRAM, BILATERAL

INDICATION

screening
TC SCORE: 10 YR RISK=  2.2%,  LIFETIME RISK =  4.4%.  SCREENING.  AB (3D) PRIORS:  [facility].

TECHNIQUE

Digital 2D CC and MLO projections obtained with 3D tomographic views per manufacturer's protocol.

COMPARISONS

April 09, 2016

FINDINGS

ACR Type 2:  25-50% There are scattered fibroglandular densities.

Small unchanged rounded densities calcifications are seen bilaterally. No concerning changes
evident.

IMPRESSION

Stable mammography. One year follow-up is recommended.

BI-RADS 2, BENIGN.

Tech Notes:

## 2022-03-08 ENCOUNTER — Encounter: Admit: 2022-03-08 | Discharge: 2022-03-08 | Payer: No Typology Code available for payment source

## 2022-05-08 IMAGING — CT ABDOMEN_PELVIS W(Adult)
2 of 3 series · 12 of 46 positions shown, 14 images · non-contrast
Comparison: none

[Series 2: abdomen_pelvis ax 3.00 br40 s3 · axial · 0.58mm/px · z∈[+1079,+1481]mm · 9 of 154 slices shown, 11 images]
[im 10/154  soft-tissue]
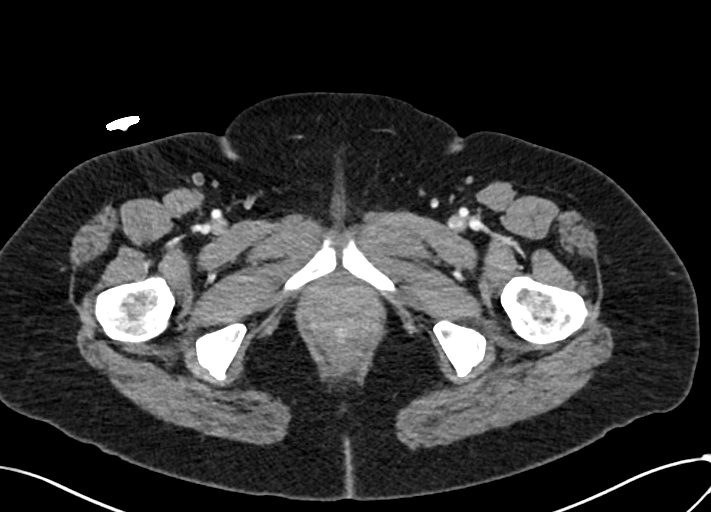
[im 10/154  bone]
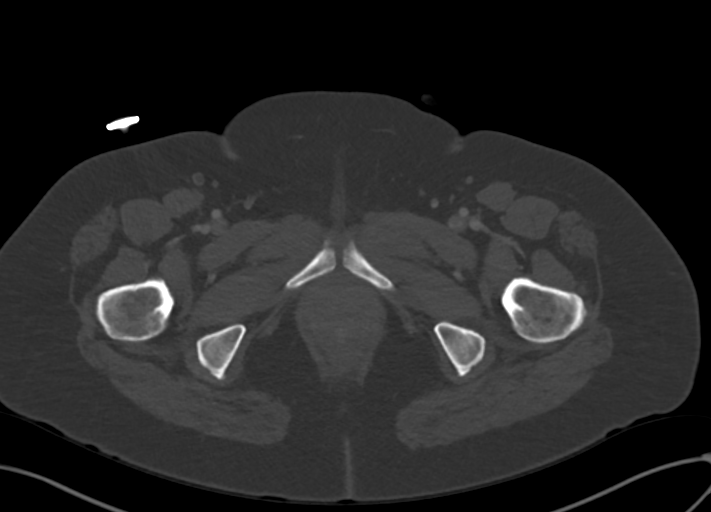
[im 30/154  soft-tissue]
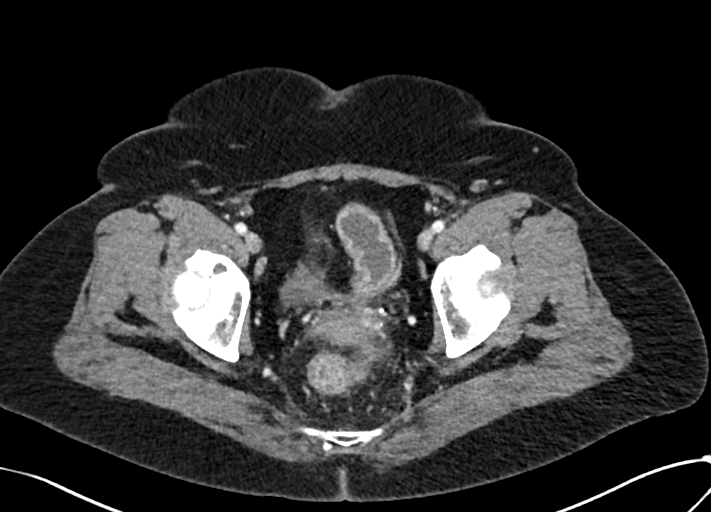
[im 45/154  soft-tissue]
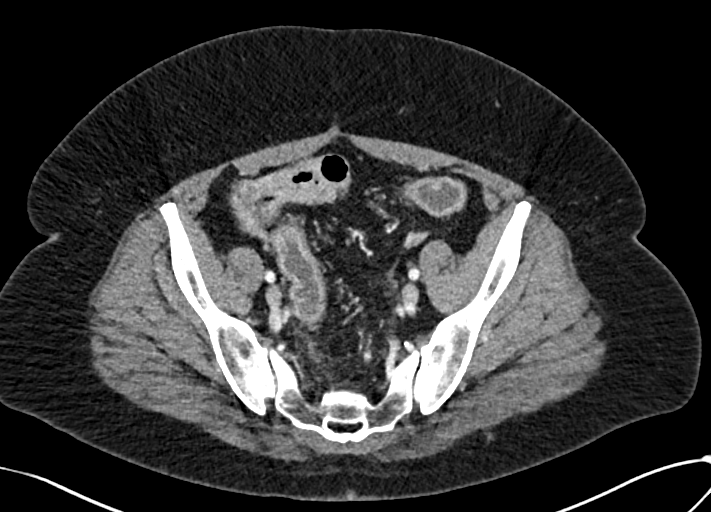
[im 60/154  soft-tissue]
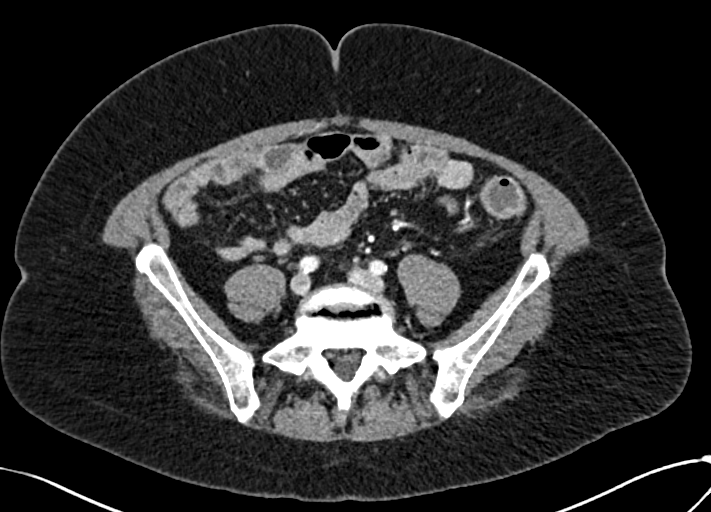
[im 79/154  soft-tissue]
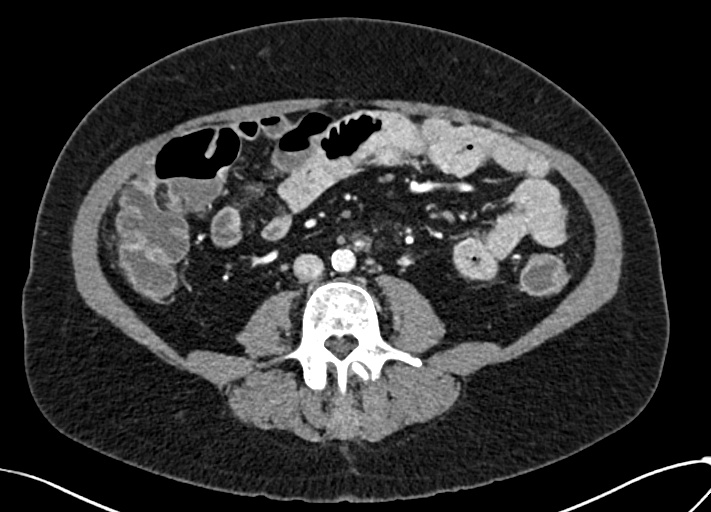
[im 94/154  soft-tissue]
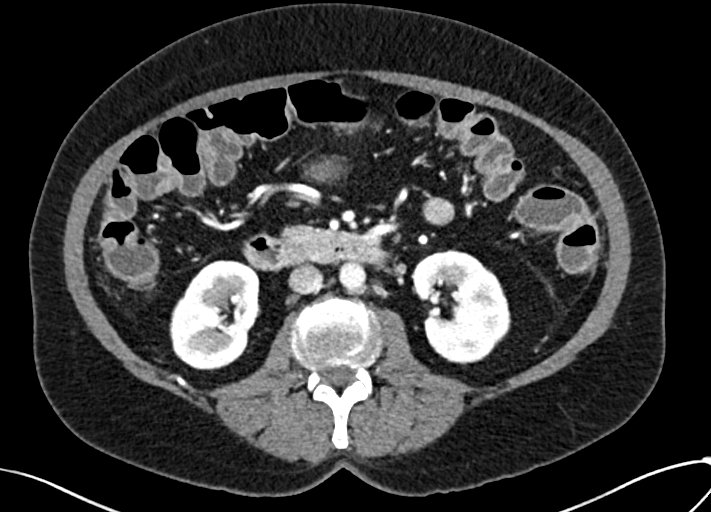
[im 109/154  soft-tissue]
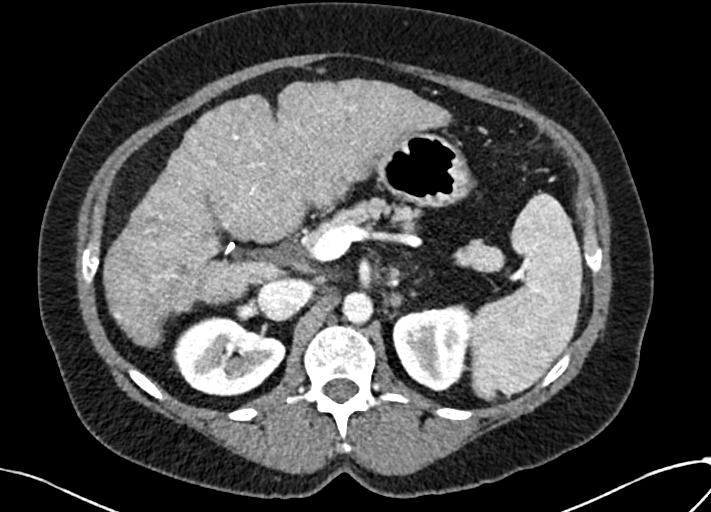
[im 129/154  soft-tissue]
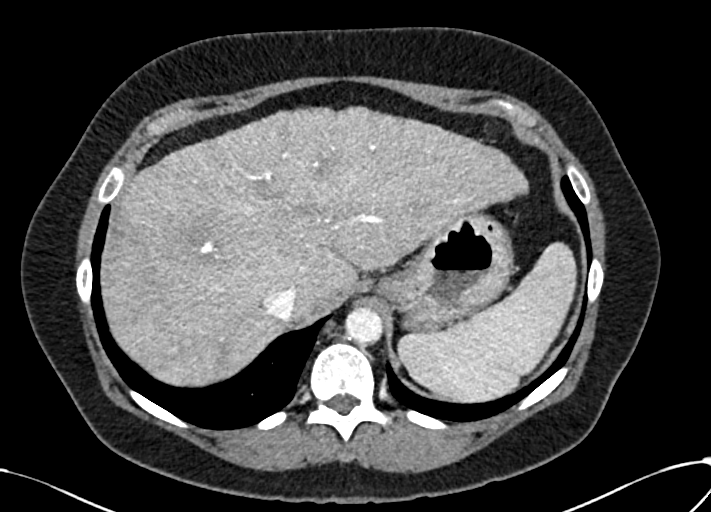
[im 144/154  soft-tissue]
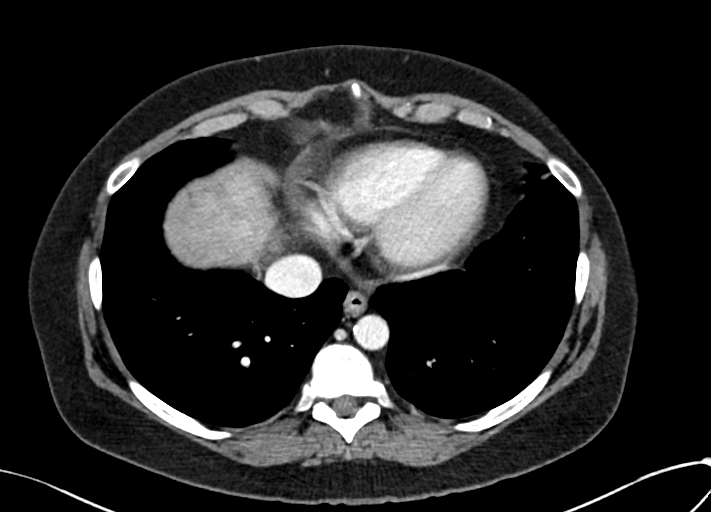
[im 144/154  bone]
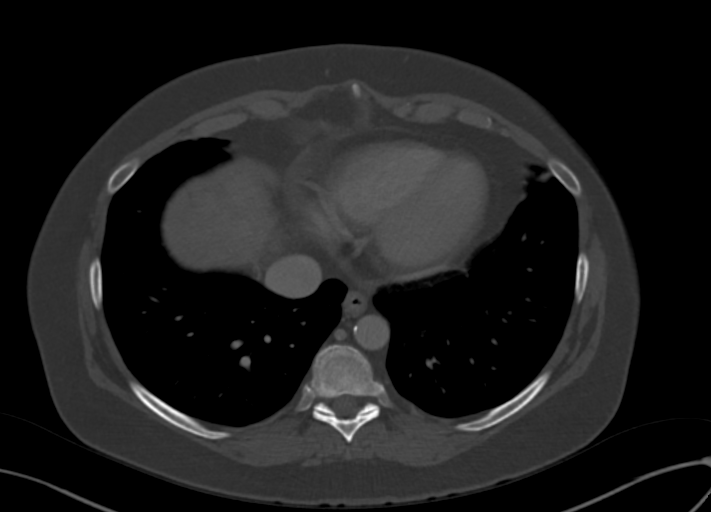

[Series 4: abdomen_pelvis cor 3.00 br40 s3 · coronal · 0.80mm/px · 3 of 98 slices shown]
[im 33/98  soft-tissue]
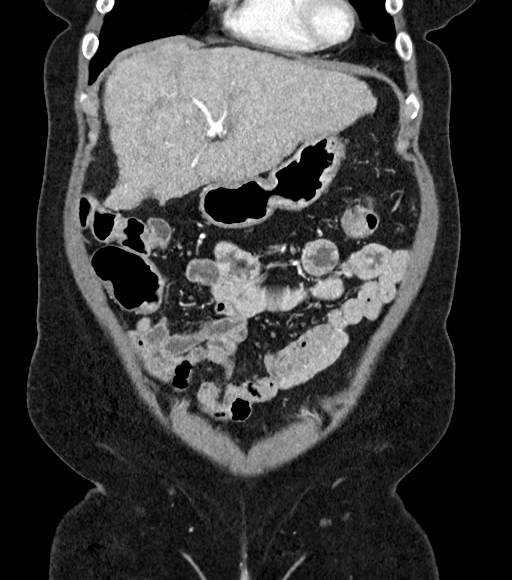
[im 44/98  soft-tissue]
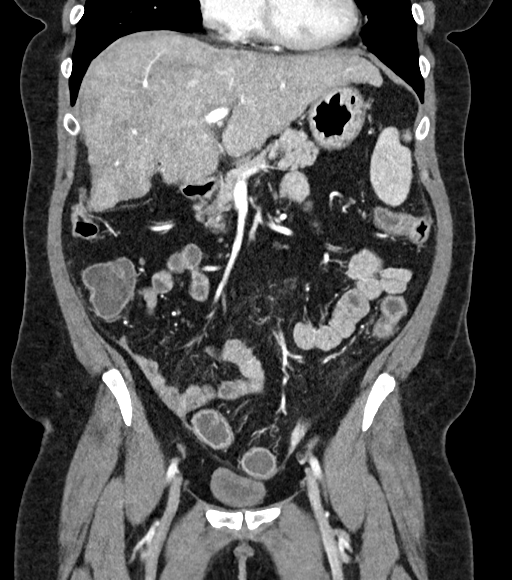
[im 54/98  soft-tissue]
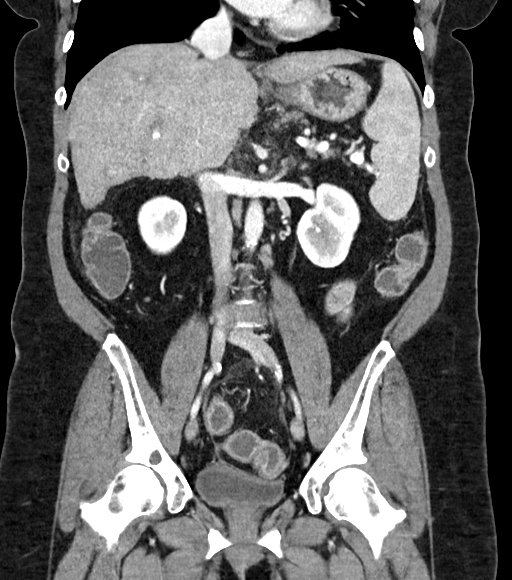

[12 of 46 positions shown; findings below may reference images not displayed]

DIAGNOSTIC STUDIES

EXAM

CT abdomen pelvis with contrast

INDICATION

pain
PT STATES HAS ABD PAIN, NAUSEA, LOSS OF APPETITE X 1 WK. HX OF CIRRHOSIS OF THE LIVER. GFR 66. 8DDJ8
6H1CJEE. CT/NM 0/0. TJ

TECHNIQUE

All CT scans at this facility use dose modulation, iterative reconstruction, and/or weight based
dosing when appropriate to reduce radiation dose to as low as reasonably achievable.

Number of previous computed tomography exams in the last 12 months is 0  .

Number of previous nuclear medicine myocardial perfusion studies in the last 12 months is 0  .

COMPARISONS

July 01, 2020

FINDINGS

Lung bases are clear. Liver demonstrates cirrhotic morphology. Portal vein opacifies normally.
Prior cholecystectomy. There is mild splenomegaly. Spleen measures 13 cm craniocaudad.

Adrenal glands are normal. Kidneys are unremarkable. No hydronephrosis.

Pancreas is normal. No retroperitoneal adenopathy. There is calcified plaque throughout the
abdominal pelvic vasculature.

Small and large bowel are normal in caliber. Fluid filled loops of colon are evident which may
indicate gastroenteritis. Appendix is normal. Reactive mesenteric lymph nodes are seen. No ascites
is evident.

Prior hysterectomy. Ovaries are atrophic. No pelvic adenopathy. No concerning osseous lesions are
seen.

IMPRESSION

Fluid-filled loops of colon with mild mural thickening and enhancement of the mid to distal colon
most consistent with underlying gastroenteritis.

Cirrhotic morphology liver with borderline splenomegaly raising the possibly of early portal
hypertension. Report called to Dr. Vilnorcharlot at [DATE] p.m..

Tech Notes:

PT STATES HAS ABD PAIN, NAUSEA, LOSS OF APPETITE X 1 WK. HX OF CIRRHOSIS OF THE LIVER. GFR 66. 8DDJ8
6H1CJEE. CT/NM 0/0. TJ

## 2022-05-10 IMAGING — CR [ID]
2 series · 2 of 2 positions shown · non-contrast
Comparison: none

[chest pa]
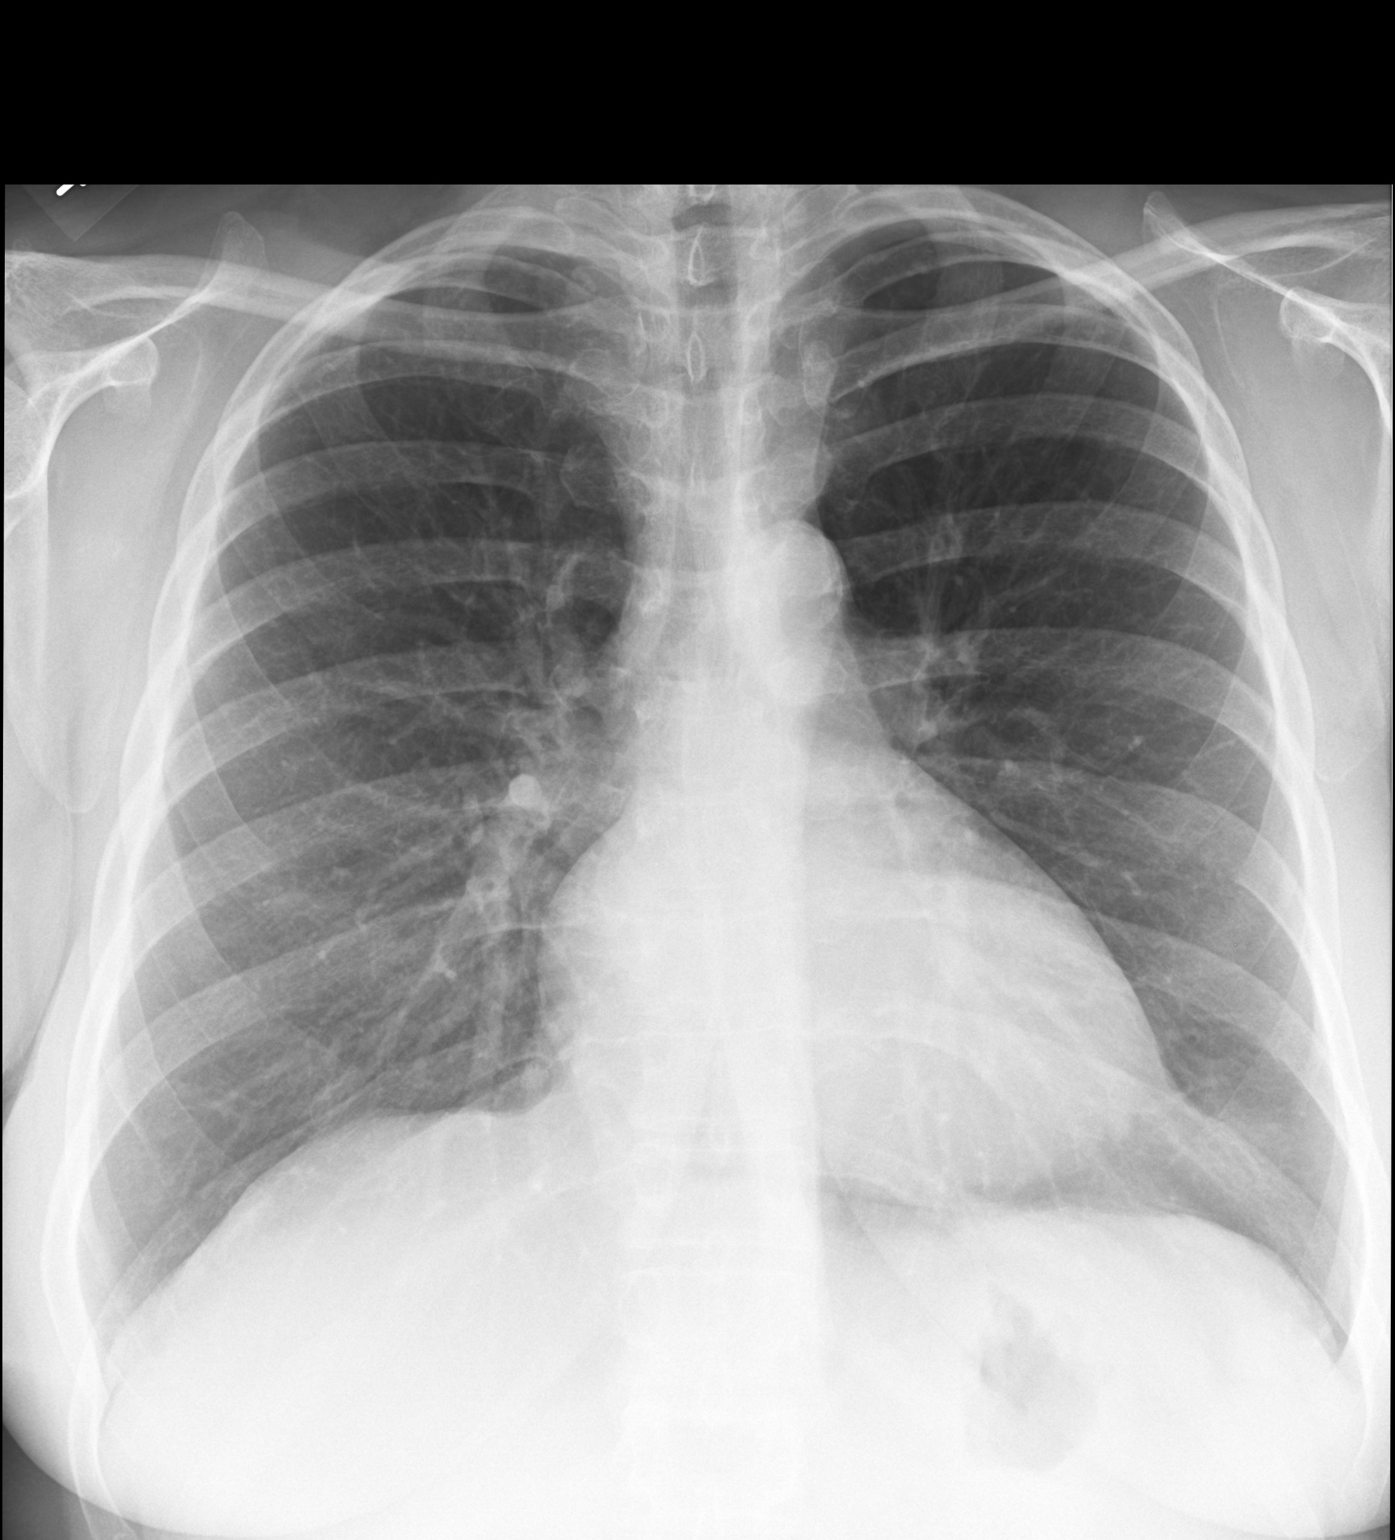

[chest lat]
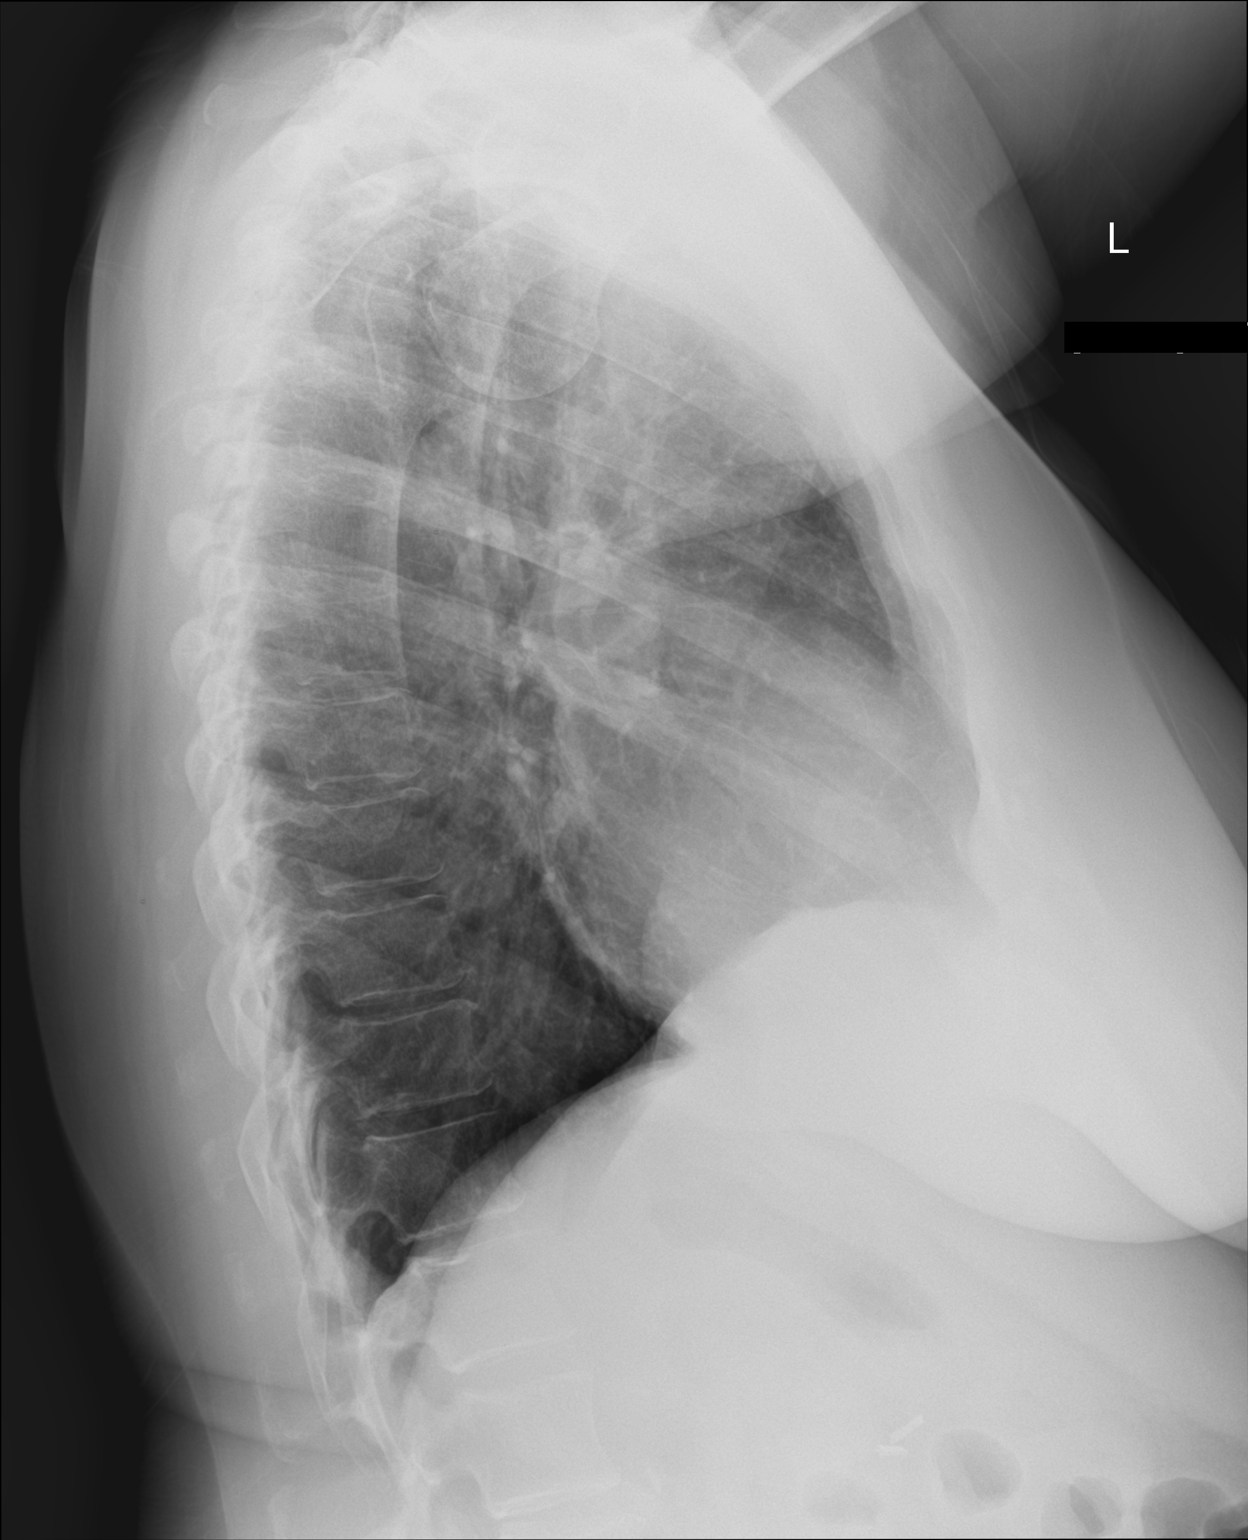

[2 of 2 positions shown; findings below may reference images not displayed]

DIAGNOSTIC STUDIES

EXAM

XR chest 2V

INDICATION

pre procedural
pre op

TECHNIQUE

PA and lateral views of the chest.

COMPARISONS

None available

FINDINGS

Cardiomediastinal silhouette is within normal limits. Lungs are clear. Bony thorax is unremarkable.

IMPRESSION

No evidence for active disease in the chest.

Tech Notes:

pre op

## 2022-05-23 ENCOUNTER — Encounter: Admit: 2022-05-23 | Discharge: 2022-05-23 | Payer: No Typology Code available for payment source

## 2022-07-12 IMAGING — US CARDUPBI
1 series · 14 of 16 positions shown · non-contrast
Comparison: none

[Series 1: us carotid duplex bi · 14 of 72 slices shown]
[im 1/72]
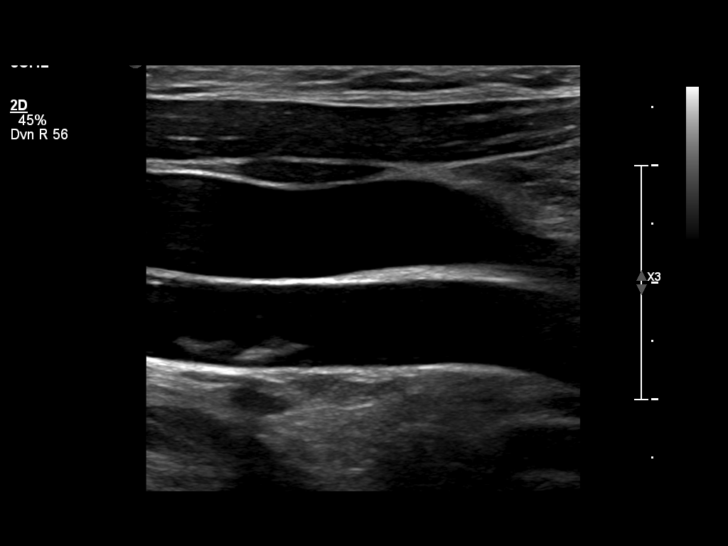
[im 5/72]
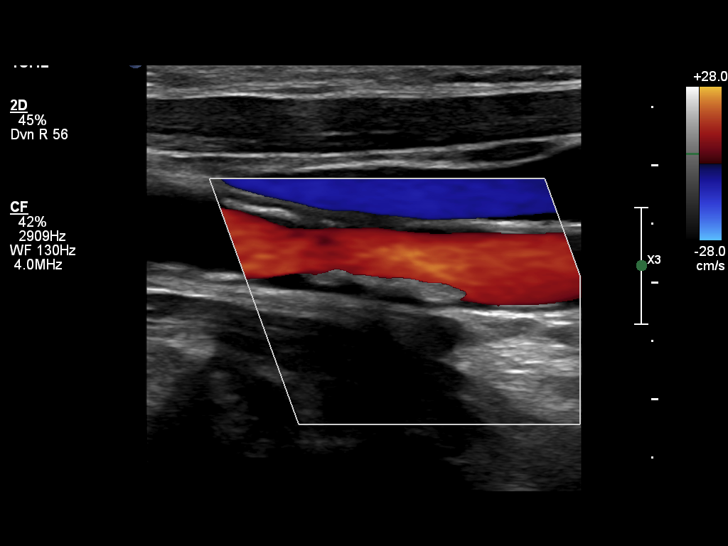
[im 10/72]
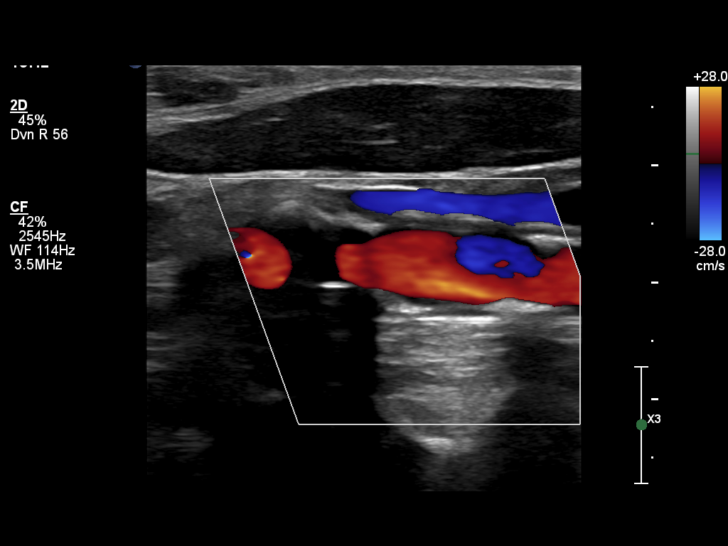
[im 19/72]
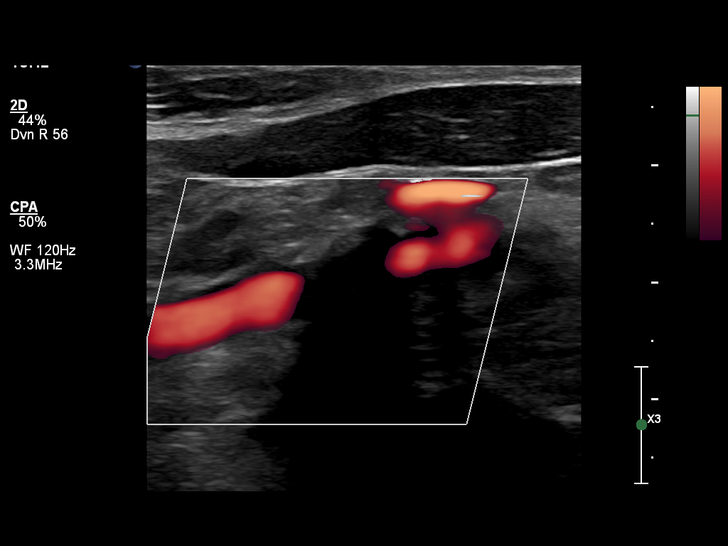
[im 24/72]
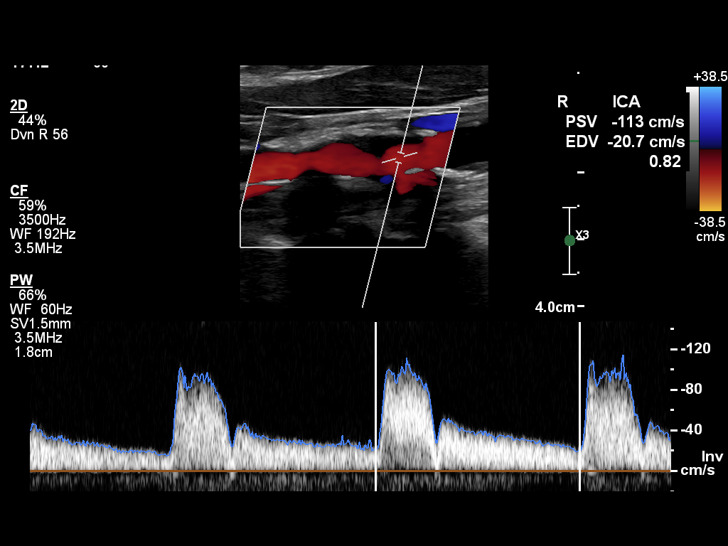
[im 29/72]
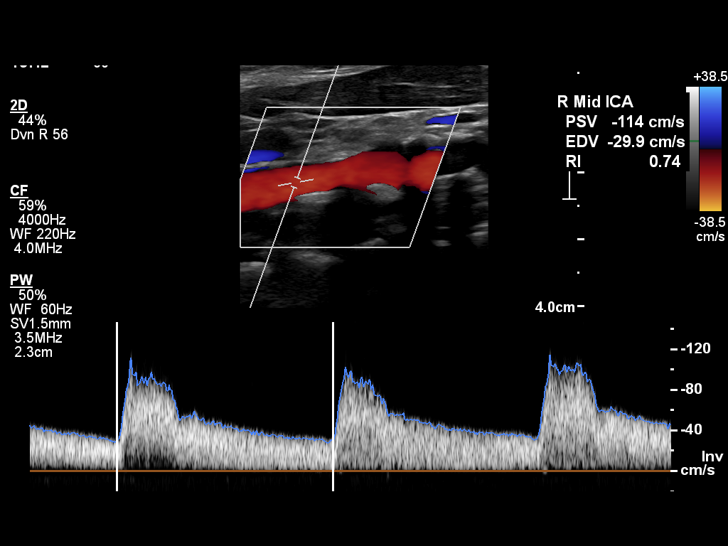
[im 34/72]
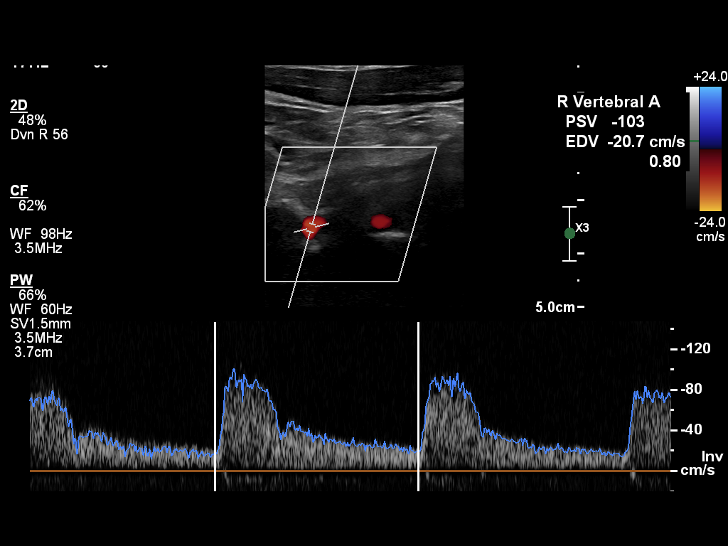
[im 38/72]
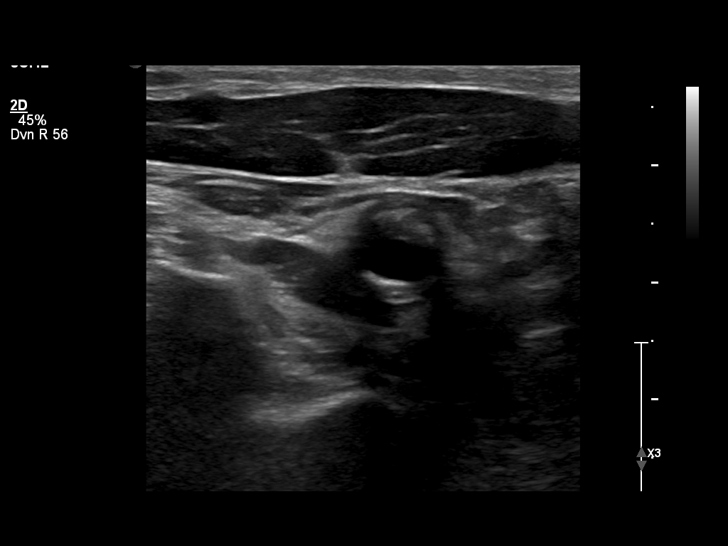
[im 43/72]
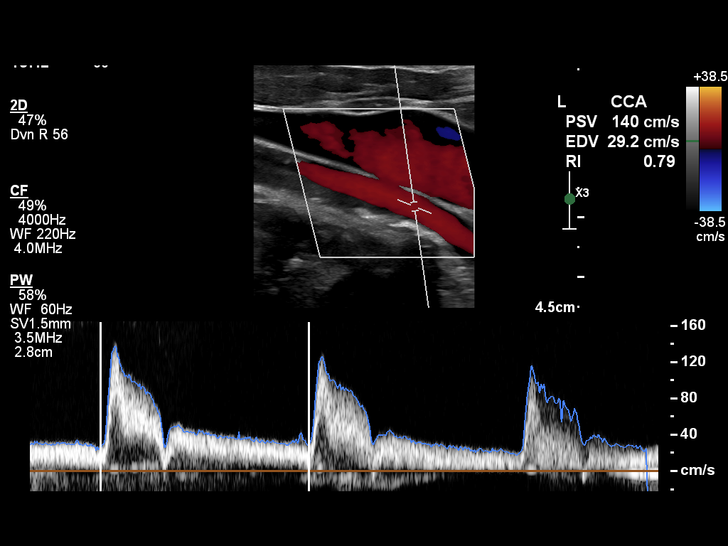
[im 48/72]
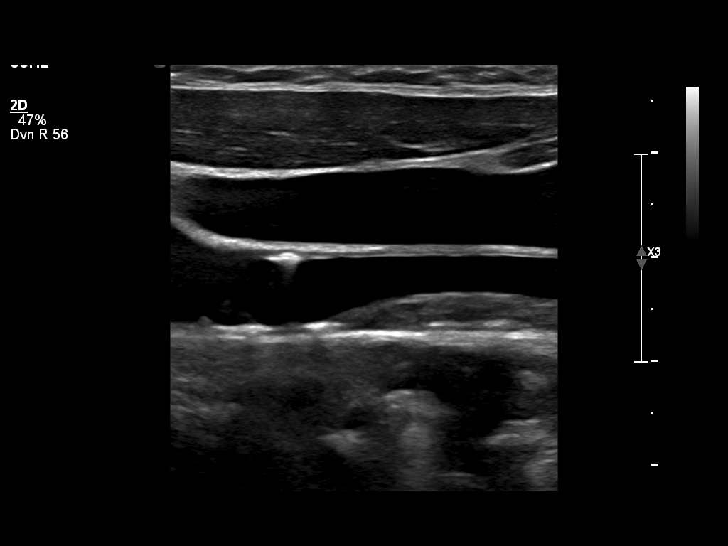
[im 57/72]
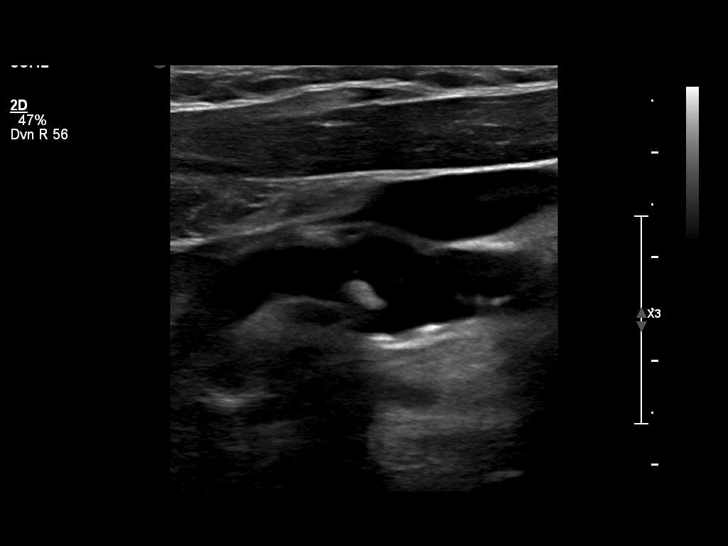
[im 62/72]
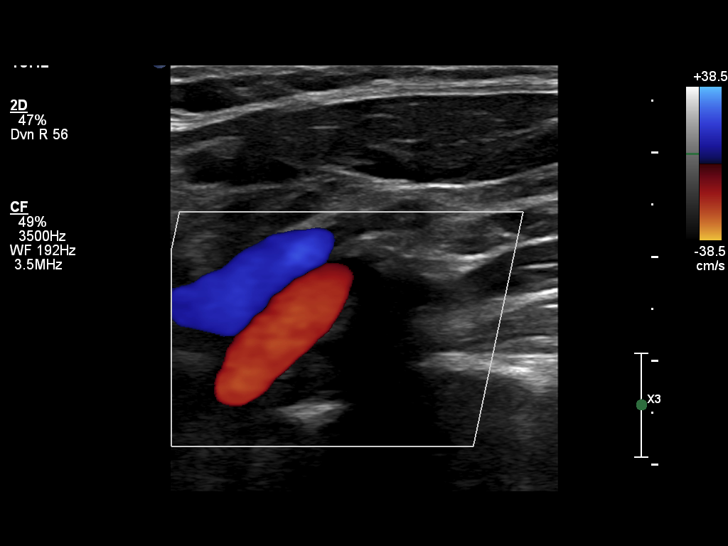
[im 67/72]
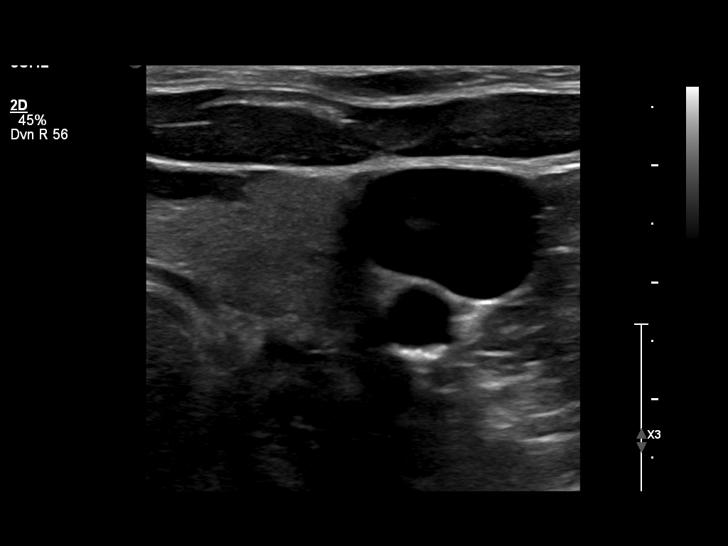
[im 72/72]
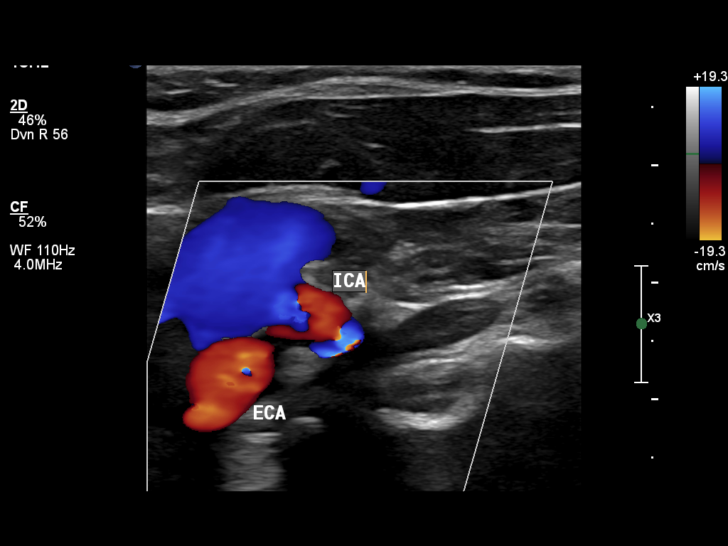

[14 of 16 positions shown; findings below may reference images not displayed]

DIAGNOSTIC STUDIES

EXAM

DUPLEX SCAN OF EXTRACRANIAL ARTERIES; COMPLETE BILATERAL STUDY, CPT 68008

INDICATION

Hypertension. Dizziness.

TECHNIQUE

NASCET criteria were utilized.

COMPARISONS

No priors available for comparison.

FINDINGS

Small to moderate amount of scattered calcified plaque. No velocity elevation. [Antegrade flow
within the  vertebral arteries.]

IMPRESSION

1. No hemodynamically significant stenosis within the internal carotid arteries (0 to 49%).

2. Antegrade flow within the vertebral arteries.

Tech Notes:

carotid artery stenosis; plaque seen on xrays in [place]; pt states she has been having headaches
and dizziness. hx of htn and cevical sx a couple months ago

## 2022-07-13 IMAGING — CR [ID]
5 series · 5 of 5 positions shown · non-contrast
Comparison: none

[t lumbar spine ap]
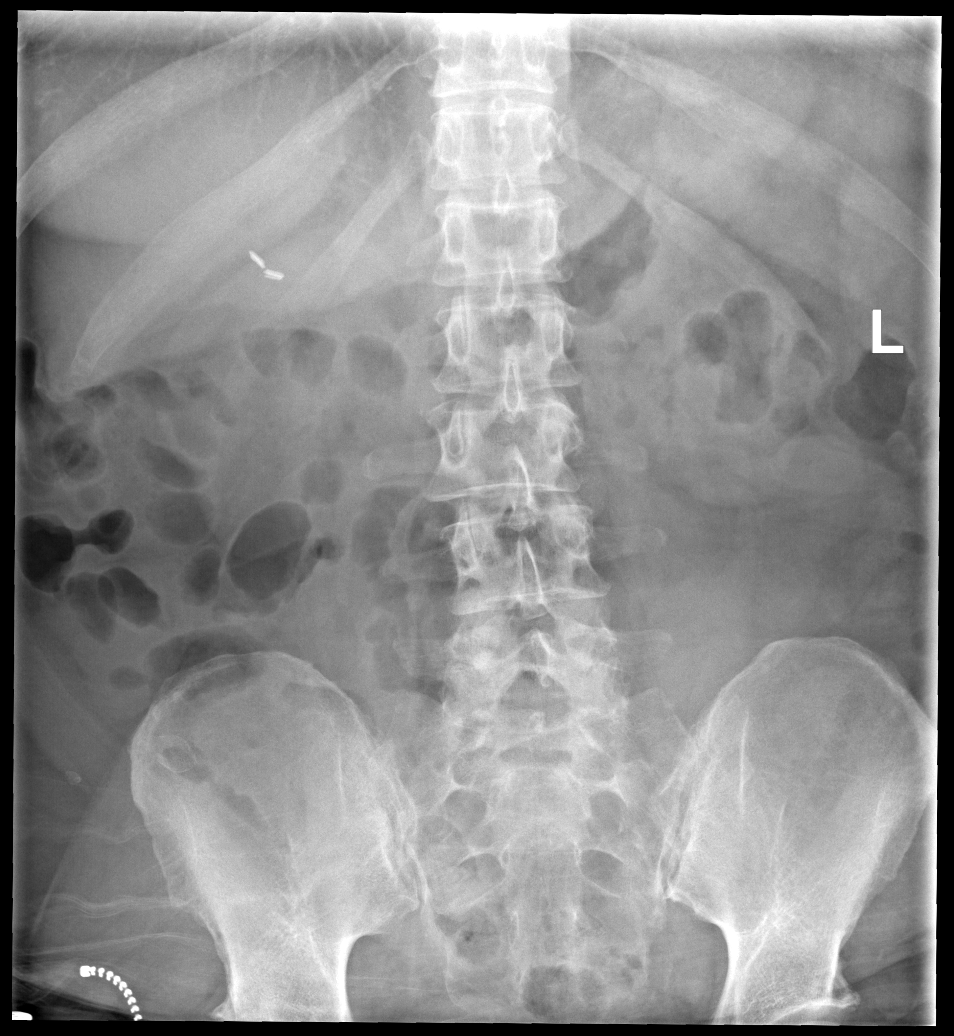

[t lumbar spine obl (1 of 2)]
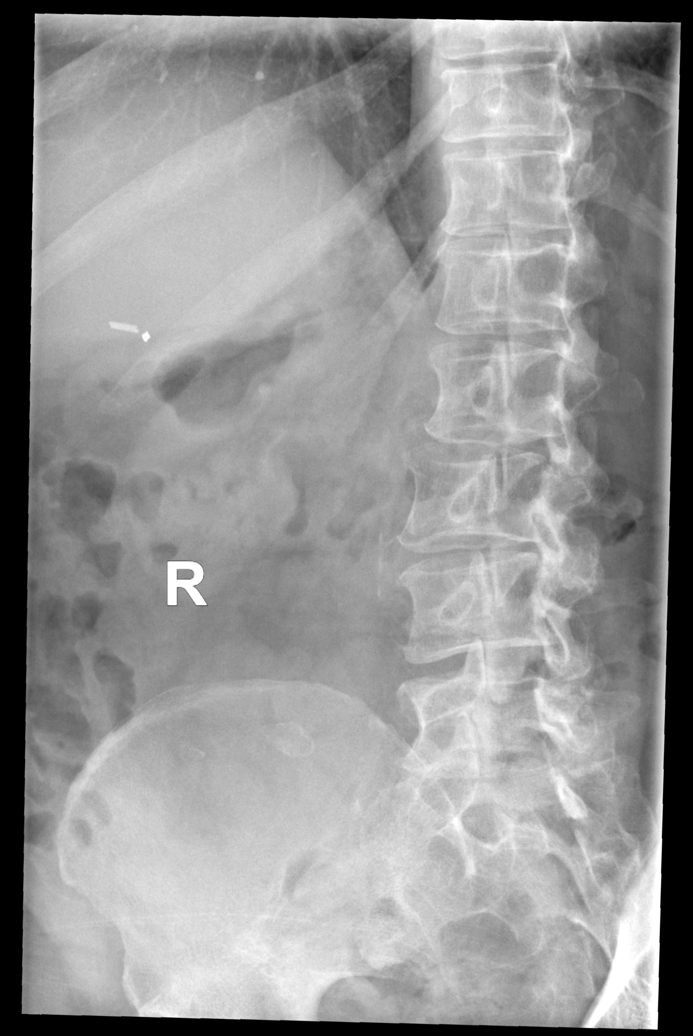

[t lumbar spine obl (2 of 2)]
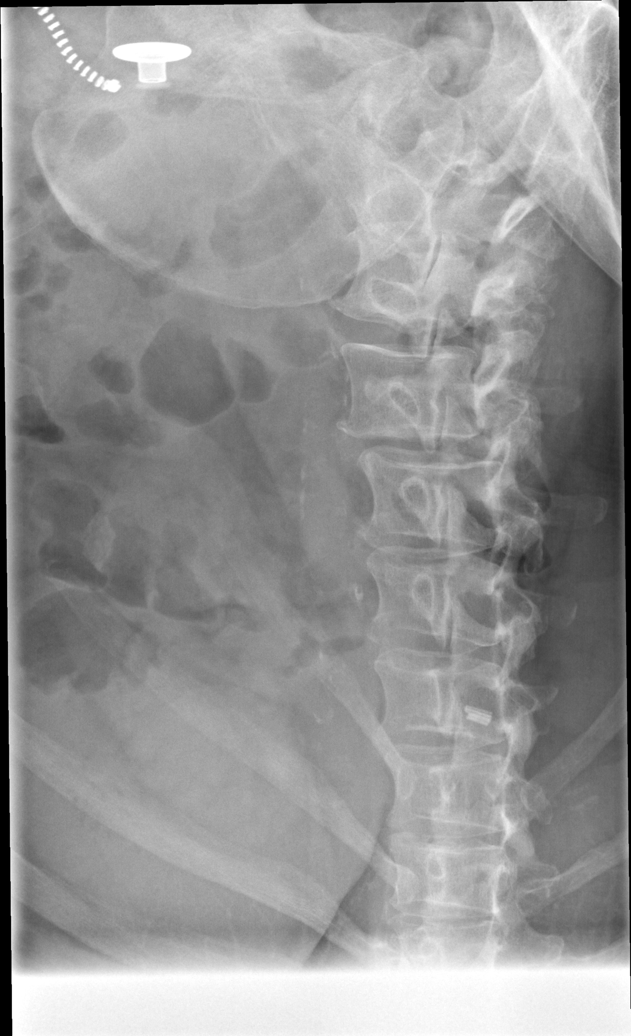

[t lumbar spine lat]
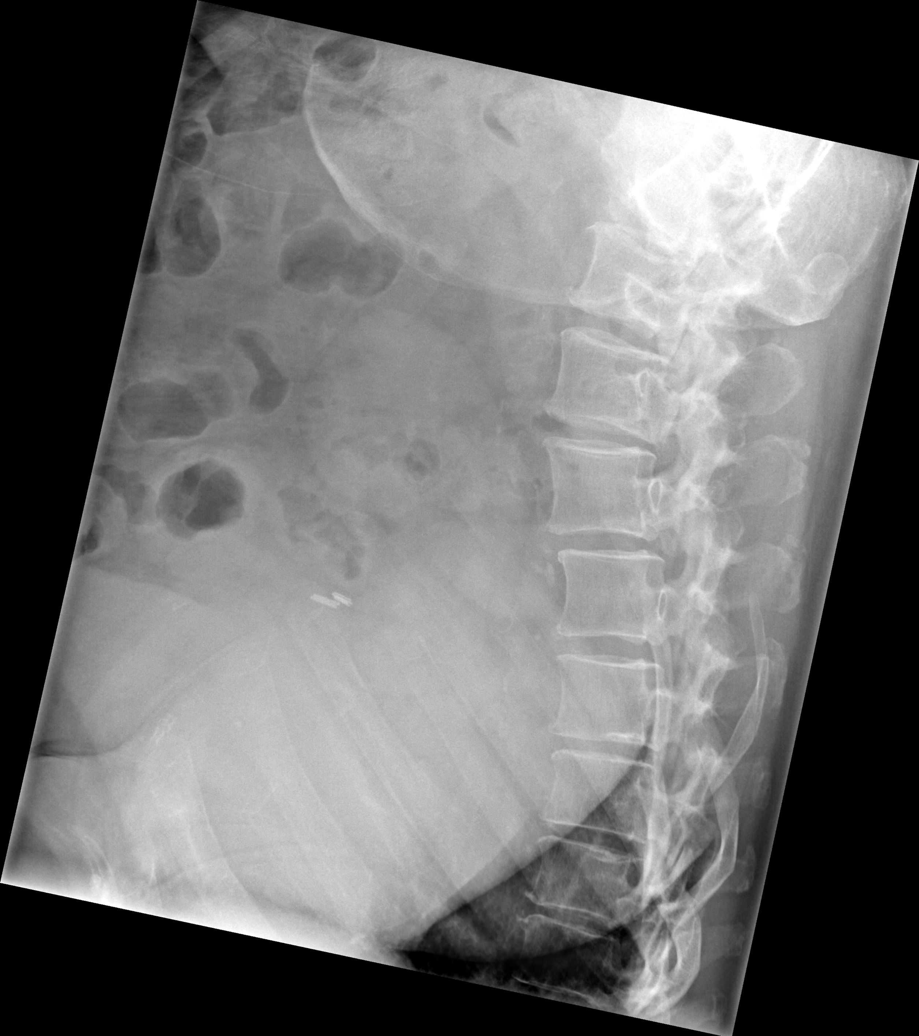

[t lumbar l-5 s-1 spot]
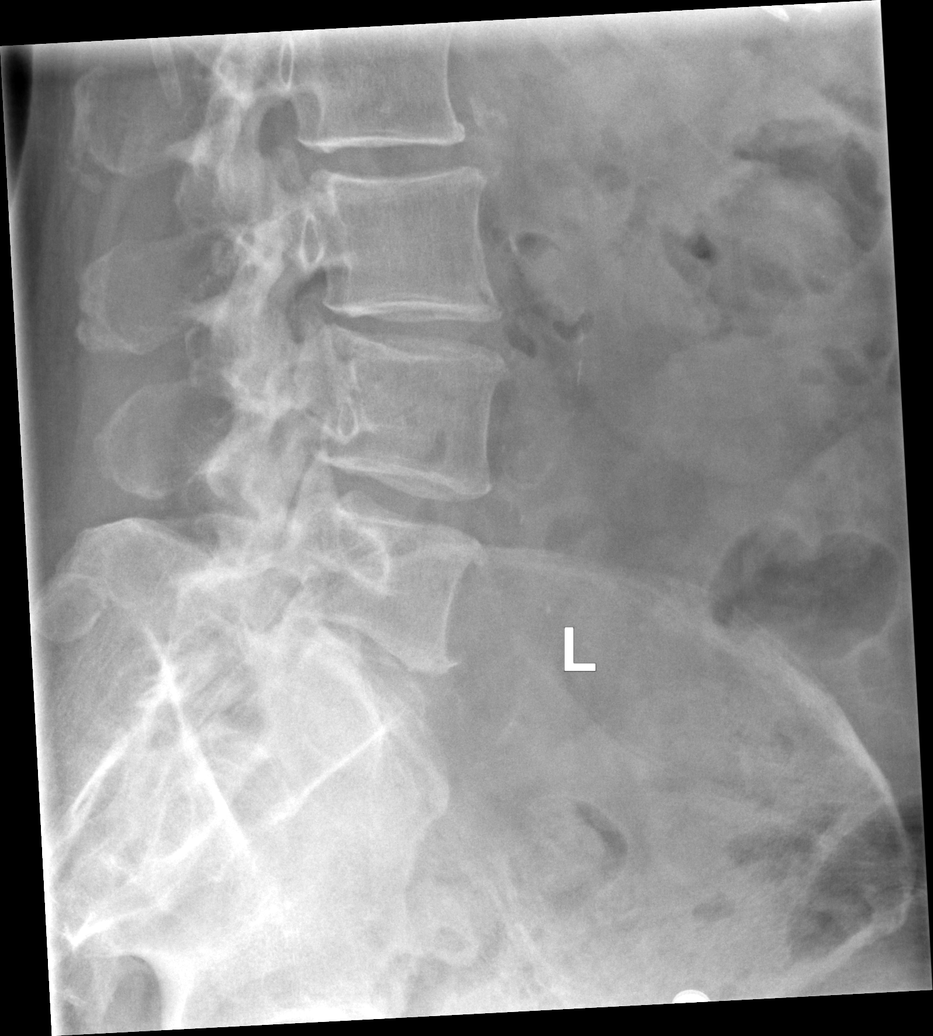

[5 of 5 positions shown; findings below may reference images not displayed]

EXAM

RADIOLOGIC EXAMINATION, SPINE, LUMBOSACRAL; MINIMUM 4 VIEWS, CPT 42665

INDICATION

LOWER BACK PAIN THAT STARTED A COUPLE WEEKS AGO AND GOES DOWN RT LEG, DENIES SURGERY HX OR TRAUMA

TECHNIQUE

5 views of the lumbar spine were acquired.

COMPARISONS

Previous examination dated 11/15/2020.

FINDINGS

AP, lateral, cone-down lateral, and bilateral oblique views of the lumbar spine show 5 nonrib-
bearing vertebral bodies. No fracture of subluxation. Vertebral body heights preserved. AP and
lateral alignment is maintained.

There is intervertebral disc space narrowing from L3 through S1 with the changes being most
prominent at the lumbosacral junction. Facet arthrosis is present as well. There is preservation of
the usual lumbar lordosis.

Postcholecystectomy clips are seen in the right upper quadrant. There is atherosclerotic calcified
plaque in the aorta and its bifurcation.

IMPRESSION

There are no fractures or subluxations of the lumbar spine. There is multilevel lumbar spondylosis
and facet arthrosis as described above. Findings are stable.

Tech Notes:

LOWER BACK PAIN THAT STARTED A COUPLE WEEKS AGO AND GOES DOWN RT LEG, DENIES SURGERY HX OR TRAUMA

## 2022-07-13 IMAGING — CR [ID]
3 series · 3 of 3 positions shown · non-contrast
Comparison: none

[w knee ap right]
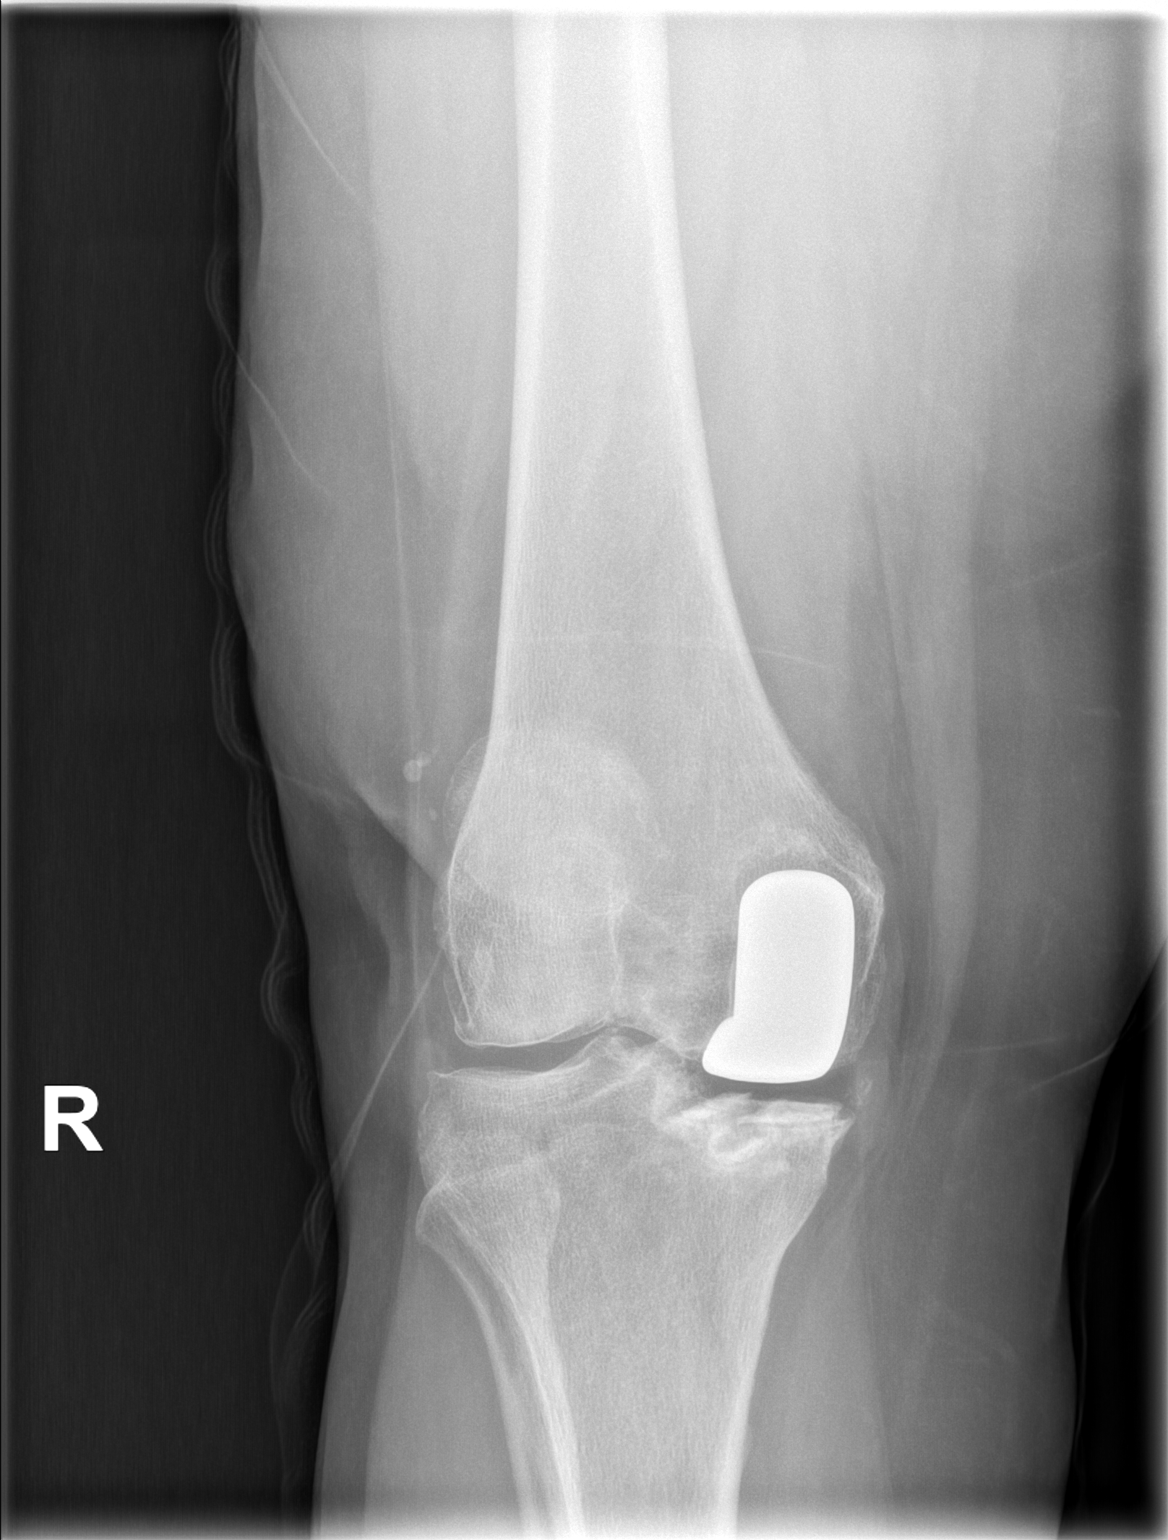

[w knee lat right]
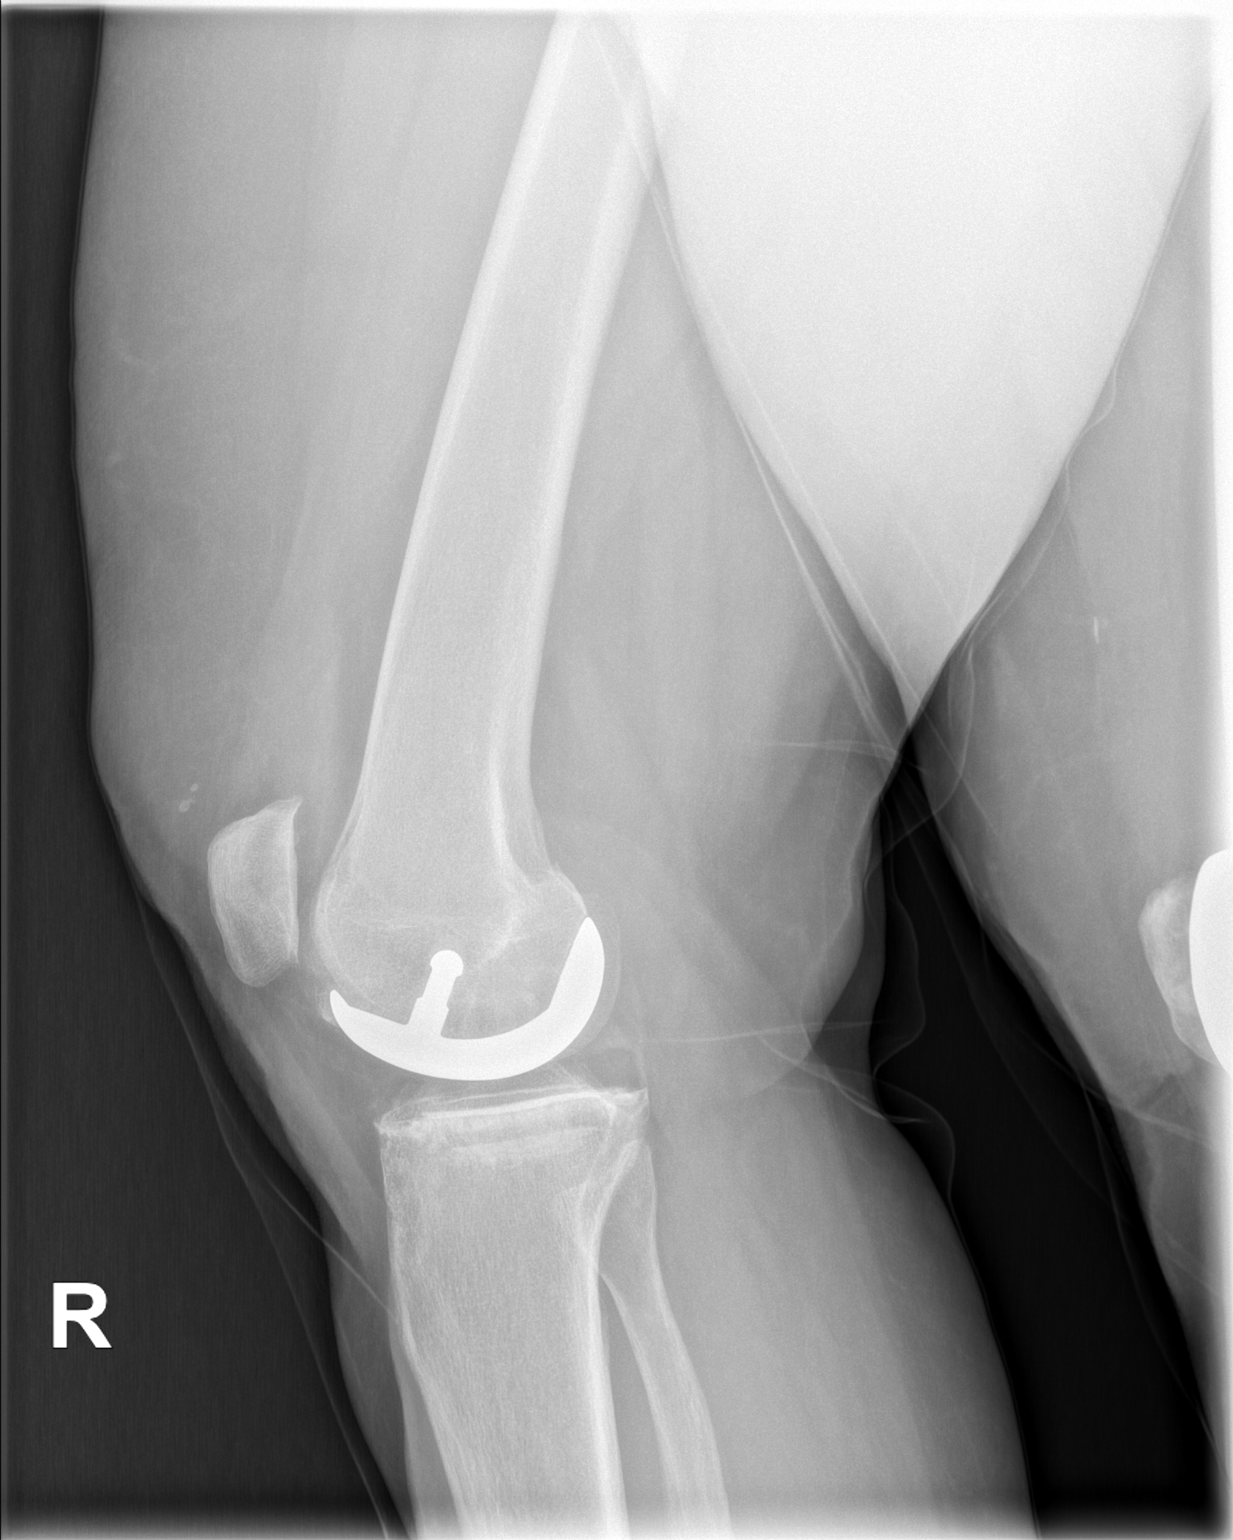

[x knee sunrise right]
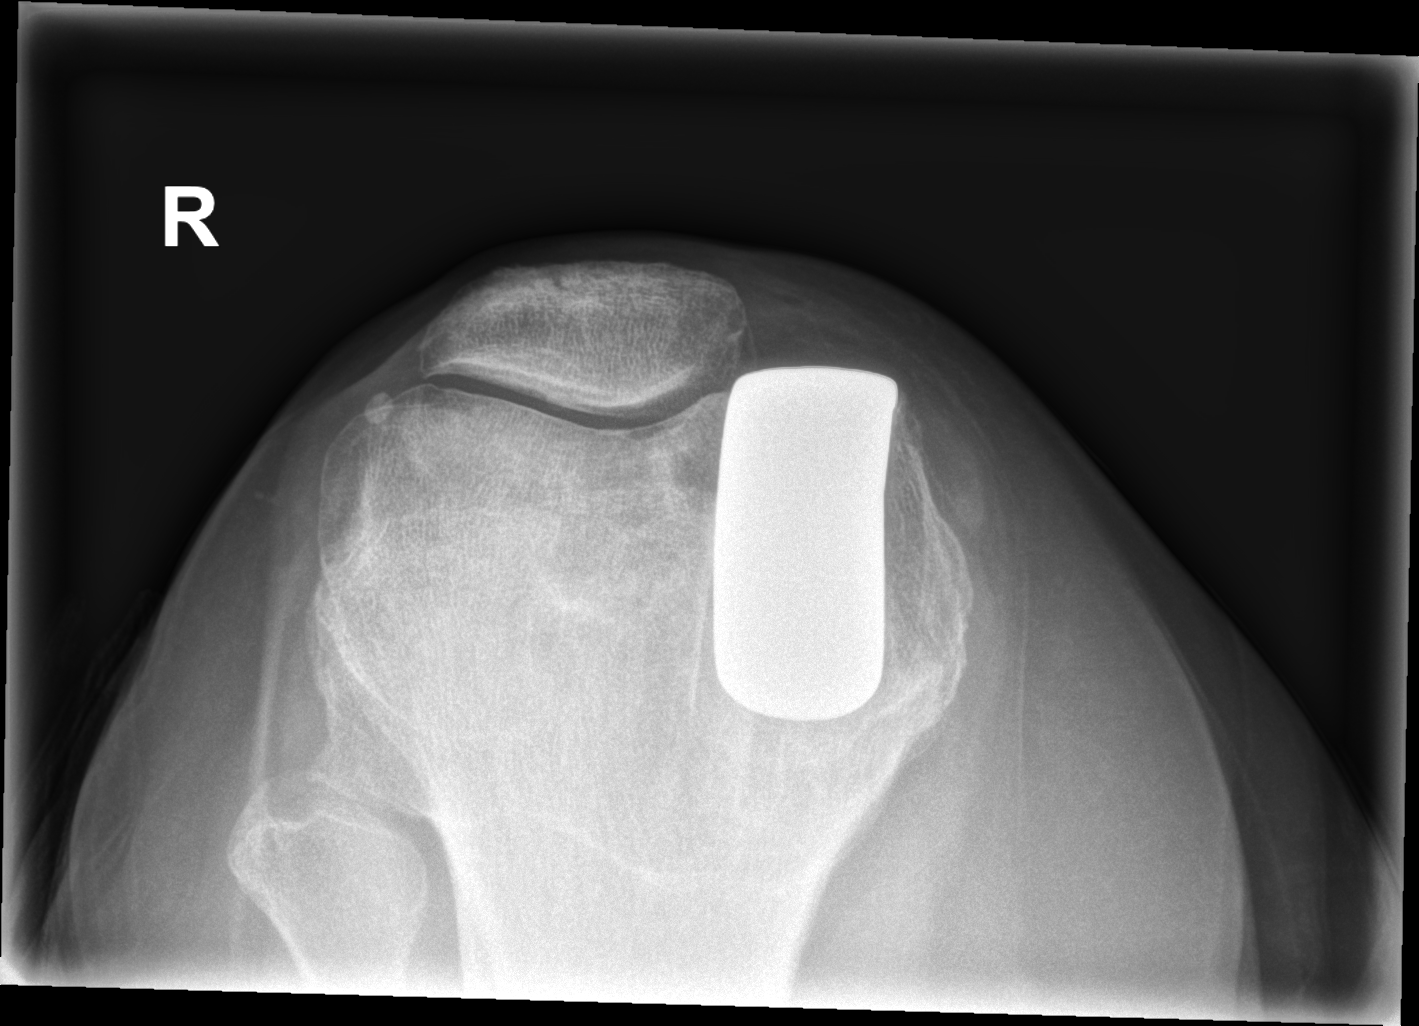

[3 of 3 positions shown; findings below may reference images not displayed]

DIAGNOSTIC STUDIES

EXAM

RADIOLOGICAL EXAMINATION, KNEE; 3 VIEWS CPT 06523

INDICATION

RIGHT KNEE KNEE PAIN, HX OF PARTIAL KNEE REPLACEMENT 20 YEARS AGO, DENIES NEW TRAUMA

TECHNIQUE

AP, lateral and sunrise views of the right knee were generated.

COMPARISONS

Previous examination dated 09/04/2021.

FINDINGS

Right knee hemiarthroplasty is identified in place. There are no abnormal masses. The orthopedic
hardware appears intact.

Mild osteophytic spurring along the lateral and patellofemoral compartments is redemonstrated.

IMPRESSION

Stable appearance of the right knee medial hemiarthroplasty.

Tech Notes:

RT KNEE PAIN, HX OF PARTIAL KNEE REPLACEMENT 20 YEARS AGO, DENIES NEW TRAUMA

## 2023-05-07 ENCOUNTER — Encounter: Admit: 2023-05-07 | Discharge: 2023-05-07

## 2023-05-08 ENCOUNTER — Encounter: Admit: 2023-05-08 | Discharge: 2023-05-08

## 2023-05-09 ENCOUNTER — Encounter: Admit: 2023-05-09 | Discharge: 2023-05-09

## 2023-06-13 ENCOUNTER — Encounter: Admit: 2023-06-13 | Discharge: 2023-06-13 | Payer: MEDICARE

## 2023-06-18 ENCOUNTER — Encounter: Admit: 2023-06-18 | Discharge: 2023-06-18 | Payer: MEDICARE

## 2023-06-18 DIAGNOSIS — M5136 Other intervertebral disc degeneration, lumbar region: Secondary | ICD-10-CM

## 2023-06-21 ENCOUNTER — Encounter: Admit: 2023-06-21 | Discharge: 2023-06-21 | Payer: MEDICARE

## 2023-07-01 ENCOUNTER — Encounter: Admit: 2023-07-01 | Discharge: 2023-07-01 | Payer: MEDICARE

## 2023-07-01 ENCOUNTER — Ambulatory Visit: Admit: 2023-07-01 | Discharge: 2023-07-01 | Payer: MEDICARE

## 2023-07-01 DIAGNOSIS — M4800 Spinal stenosis, site unspecified: Secondary | ICD-10-CM

## 2023-07-01 DIAGNOSIS — IMO0002 Ulcer: Secondary | ICD-10-CM

## 2023-07-01 DIAGNOSIS — I10 Essential (primary) hypertension: Secondary | ICD-10-CM

## 2023-07-01 DIAGNOSIS — F32A Depression: Secondary | ICD-10-CM

## 2023-07-01 DIAGNOSIS — F419 Anxiety disorder, unspecified: Secondary | ICD-10-CM

## 2023-07-01 DIAGNOSIS — K7460 Unspecified cirrhosis of liver: Secondary | ICD-10-CM

## 2023-07-01 DIAGNOSIS — M48062 Spinal stenosis, lumbar region with neurogenic claudication: Secondary | ICD-10-CM

## 2023-07-01 DIAGNOSIS — R011 Cardiac murmur, unspecified: Secondary | ICD-10-CM

## 2023-07-01 DIAGNOSIS — R519 Generalized headaches: Secondary | ICD-10-CM

## 2023-07-01 DIAGNOSIS — M5136 Other intervertebral disc degeneration, lumbar region: Secondary | ICD-10-CM

## 2023-07-01 DIAGNOSIS — M2550 Pain in unspecified joint: Secondary | ICD-10-CM

## 2023-07-01 NOTE — Progress Notes
SPINE C[facility] HISTORY AND PHYSICAL    Chief Complaint   Patient presents with    Lower Back - New Patient       Subjective      Dictation on: 07/01/2023 10:55 AM by: Lillia Pauls            Past Medical History:   Diagnosis Date    Anxiety 12/18/1998    Cirrhosis (HCC) 02/23/2019    Degenerative disc disease, lumbar 06/22/2019    Depression 12/18/1998    Essential hypertension 12/18/2016    Generalized headaches     occasionally    Heart murmur 12/18/2017    Joint pain 12/19/2015    Spinal stenosis 06/22/2019    Ulcer 02/23/2019       Surgical History:   Procedure Laterality Date    HX CARPAL TUNNEL RELEASE Bilateral 2021    HX ARTHROSCOPIC SURGERY  12/18/2000    HX HYSTERECTOMY  12/19/1999    HX JOINT REPLACEMENT  10/04/2001    KNEE SURGERY  10/04/2001       family history includes Alcohol liver disease in her father; Arthritis in her mother; Back pain in her father; Diabetes in her paternal grandmother; Joint Pain in her father; Stroke in her father and paternal grandmother.    Social History     Socioeconomic History    Marital status: Married   Tobacco Use    Smoking status: Former     Current packs/day: 0.00     Types: Cigarettes     Start date: 02/23/2019     Quit date: 04/25/2019     Years since quitting: 4.1    Smokeless tobacco: Former     Quit date: 04/25/2019    Tobacco comments:     Was not a constant smoker   Substance and Sexual Activity    Alcohol use: Not Currently    Drug use: Never    Sexual activity: Not Currently     Partners: Male     Birth control/protection: Pill       Allergies   Allergen Reactions    Morphine ANXIETY, ITCHING and REDNESS     Itchy, increased anxiety            Current Outpatient Medications:     amlodipine-atorvastatin (CADUET) 10-80 mg tablet, , Disp: , Rfl:     escitalopram oxalate (LEXAPRO) 10 mg tablet, , Disp: , Rfl:     losartan (COZAAR) 50 mg tablet, , Disp: , Rfl:     oxyCODONE/acetaminophen (PERCOCET) 5/325 mg tablet, , Disp: , Rfl:     traMADoL (ULTRAM) 50 mg tablet, TWICE A DAY, Disp: , Rfl:     traZODone (DESYREL) 50 mg tablet, , Disp: , Rfl:     vitamins, multiple tablet, Take one tablet by mouth daily., Disp: , Rfl:     Vitals:    07/01/23 1020   BP: 102/56   Pulse: 59   SpO2: 100%   PainSc: Eight   Weight: 86.9 kg (191 lb 9.6 oz)   Height: 160 cm (5' 3)       Oswestry Total Score:: 54    Pain Score: Eight    Body mass index is 33.94 kg/m?Marland Kitchen    Review of Systems                Answers submitted by the patient for this visit:  Review Of Systems (Submitted on 07/01/2023)  Crying: No

## 2023-07-02 ENCOUNTER — Encounter: Admit: 2023-07-02 | Discharge: 2023-07-02 | Payer: MEDICARE

## 2023-07-02 DIAGNOSIS — R799 Abnormal finding of blood chemistry, unspecified: Secondary | ICD-10-CM

## 2023-07-02 DIAGNOSIS — R791 Abnormal coagulation profile: Secondary | ICD-10-CM

## 2023-07-02 DIAGNOSIS — M48062 Spinal stenosis, lumbar region with neurogenic claudication: Secondary | ICD-10-CM

## 2023-07-02 MED ORDER — CELECOXIB 200 MG PO CAP
200 mg | Freq: Once | ORAL | 0 refills
Start: 2023-07-02 — End: ?

## 2023-07-02 MED ORDER — LACTATED RINGERS IV SOLP
1000 mL | INTRAVENOUS | 0 refills
Start: 2023-07-02 — End: ?

## 2023-07-02 MED ORDER — CEFAZOLIN 2 GRAM IV SOLR
2 g | Freq: Once | INTRAVENOUS | 0 refills
Start: 2023-07-02 — End: ?

## 2023-07-02 MED ORDER — PRESURGERY KIT B
PACK | 0 refills | Status: AC
Start: 2023-07-02 — End: ?
  Filled 2023-07-03: 1d supply
  Filled 2023-07-04: qty 1, 1d supply, fill #1

## 2023-07-02 MED ORDER — ACETAMINOPHEN 500 MG PO TAB
1000 mg | Freq: Once | ORAL | 0 refills
Start: 2023-07-02 — End: ?

## 2023-07-03 ENCOUNTER — Encounter: Admit: 2023-07-03 | Discharge: 2023-07-03 | Payer: MEDICARE

## 2023-07-04 ENCOUNTER — Encounter: Admit: 2023-07-04 | Discharge: 2023-07-04 | Payer: MEDICARE

## 2023-07-26 NOTE — Patient Instructions
SURGERY 08/13/23- CHECK IN WITH ADMISSIONS AT   0600AM THE MORNING OF SURGERY. NOTHING TO EAT OR DRINK AFTER MIDNIGHT THE NIGHT BEFORE YOUR SURGERY.    USE CHLORHEXIDINE WIPES AND SHAKES AS DIRECTED.    YOU WILL LEAVE THE HOSPITAL WITH PRESCRIPTIONS FOR PAIN MEDICATION.    WHEN YOU ARE IN NEED OF A REFILL, PLEASE CALL OUR OFFICE. PLEASE REMEMBER TO GIVE OUR OFFICE 1-2 DAYS ADVANCED NOTICE THAT YOU REQUIRE A REFILL SO YOU DO NOT RUN OUT OF MEDICATION BEFORE A NEW PRESCRIPTION CAN BE SENT.    SAME DAY PRESCRIPTIONS CANNOT BE OBTAINED.    AFTER SURGERY, IF YOU HAVE ANY QUESTIONS, PLEASE CALL [phone].    IF AFTER HOURS, WEEKENDS OR HOLIDAYS, YOU CAN CALL [phone] AND ASK FOR THE ORTHOPEDIC SPINE SURGERY RESIDENT ON CALL FOR ANY URGENT OR EMERGENT NEEDS.

## 2023-08-01 ENCOUNTER — Encounter: Admit: 2023-08-01 | Discharge: 2023-08-01 | Payer: MEDICARE

## 2023-08-01 ENCOUNTER — Ambulatory Visit: Admit: 2023-08-01 | Discharge: 2023-08-02 | Payer: MEDICARE

## 2023-08-01 ENCOUNTER — Ambulatory Visit: Admit: 2023-08-01 | Discharge: 2023-08-01 | Payer: MEDICARE

## 2023-08-01 DIAGNOSIS — M2550 Pain in unspecified joint: Secondary | ICD-10-CM

## 2023-08-01 DIAGNOSIS — Z01818 Encounter for other preprocedural examination: Secondary | ICD-10-CM

## 2023-08-01 DIAGNOSIS — R799 Abnormal finding of blood chemistry, unspecified: Secondary | ICD-10-CM

## 2023-08-01 DIAGNOSIS — F32A Depression: Secondary | ICD-10-CM

## 2023-08-01 DIAGNOSIS — D696 Thrombocytopenia, unspecified: Secondary | ICD-10-CM

## 2023-08-01 DIAGNOSIS — R791 Abnormal coagulation profile: Secondary | ICD-10-CM

## 2023-08-01 DIAGNOSIS — M5136 Other intervertebral disc degeneration, lumbar region: Secondary | ICD-10-CM

## 2023-08-01 DIAGNOSIS — K7460 Unspecified cirrhosis of liver: Secondary | ICD-10-CM

## 2023-08-01 DIAGNOSIS — R011 Cardiac murmur, unspecified: Secondary | ICD-10-CM

## 2023-08-01 DIAGNOSIS — M48062 Spinal stenosis, lumbar region with neurogenic claudication: Secondary | ICD-10-CM

## 2023-08-01 DIAGNOSIS — IMO0002 Ulcer: Secondary | ICD-10-CM

## 2023-08-01 DIAGNOSIS — I10 Essential (primary) hypertension: Secondary | ICD-10-CM

## 2023-08-01 DIAGNOSIS — F419 Anxiety disorder, unspecified: Secondary | ICD-10-CM

## 2023-08-01 DIAGNOSIS — M549 Dorsalgia, unspecified: Secondary | ICD-10-CM

## 2023-08-01 DIAGNOSIS — R519 Generalized headaches: Secondary | ICD-10-CM

## 2023-08-01 DIAGNOSIS — M4800 Spinal stenosis, site unspecified: Secondary | ICD-10-CM

## 2023-08-01 LAB — COMPREHENSIVE METABOLIC PANEL
ALBUMIN: 4.5 g/dL (ref 3.5–5.0)
ALK PHOSPHATASE: 95 U/L (ref 25–110)
ALT: 35 U/L (ref 7–56)
ANION GAP: 10 (ref 3–12)
AST: 41 U/L — ABNORMAL HIGH (ref 7–40)
BLD UREA NITROGEN: 17 mg/dL (ref 7–25)
CALCIUM: 9.7 mg/dL (ref 8.5–10.6)
CO2: 27 MMOL/L (ref 21–30)
CREATININE: 0.6 mg/dL (ref 0.4–1.00)
GLUCOSE,PANEL: 81 mg/dL (ref 70–100)
SODIUM: 142 MMOL/L (ref 137–147)
TOTAL PROTEIN: 7.5 g/dL (ref 6.0–8.0)

## 2023-08-01 LAB — PROTIME INR (PT)
INR: 1.1 MMOL/L (ref 0.9–1.2)
PROTIME: 12 s (ref 10.2–12.9)

## 2023-08-01 NOTE — Pre-Anesthesia Patient Instructions
PREPROCEDURE INFORMATION    Arrival at the hospital  [facility]  [address]  [place], [place] [address]    Park in the [facility] [facility], located directly across from the main entrance to the hospital.  Enter through the ground floor main hospital entrance and check in at the Information Desk in the lobby.  They will validate your parking ticket and direct you to the next location.    You will receive your day of surgery arrival time from your Surgeon's office.  If you have any questions, please contact your Surgeon's office for assistance.    Eating or drinking before surgery  If you received a pre-surgery kit from your surgeon, follow the instructions in the kit. If you are diabetic, do not drink the carbohydrate nutritional drinks that may be included in your kit.    Planning transportation for outpatient procedure  For your safety, you will need to arrange for a responsible ride/person to accompany you home due to sedation or anesthesia with your procedure.  An Uber, taxi or other public transportation driver is not considered a responsible person to accompany you home.    Bath/Shower Instructions  Put on clean clothes after bath or shower.  Avoid using lotion and oils.  If you are having surgery above the waist, wear a shirt that fastens up the front.  Sleep on clean sheets if bath or shower is done the night before procedure.  Wash using the antibacterial wipes you received in your pre-surgery kit as directed.    Morning of your procedure:  Brush your teeth and tongue  Do not smoke, vape, chew or user any tobacco products.  Do not shave the area where you will have surgery.  Remove nail polish, makeup and all jewelry (including piercings) before coming to the hospital.  Dress in clean, loose, comfortable clothing.    Valuables  Leave money, credit cards, jewelry, and any other valuables at home. If medications are being filled and picked up at a [facility] pharmacy, payment will be needed. [facility] is not responsible for the loss or breakage of personal items.    What to bring to the hospital  ID/Insurance card  Medical Device card  Official documents for legal guardianship  Copy of your Living Will, Advanced Directives, and/or Durable Power of Attorney. If you have these documents, please bring them to the admissions office on the day of your surgery to be scanned into your records.  Do not bring medications from home unless instructed by a pharmacist.  CPAP/BiPAP machine (including all supplies)  Walker, cane, or motorized scooter  Cases for glasses/hearing aids/contact lens (bring solutions for contacts)     Notify us at [facility]: [phone] on the day of your procedure if:  You need to cancel your procedure.  You are going to be late.    Notify your surgeon if:  You become ill with a cough, fever, sore throat, nausea, vomiting or flu-like symptoms.  You have any open wounds/sores that are red, painful, draining, or are new since you last saw the doctor.  You need to cancel your procedure.    Preparing to get your medications at discharge  Your surgeon may prescribe you medications to take after your procedure.  If you like the convenience of having your medications filled here at [facility], please do the following:  Go to [place] pharmacy after your PAC appointment to put a credit card on file.    Call [place] pharmacy at [phone] (Monday-Friday 7am-9pm or Saturday and Sunday 9am-5pm) to put a credit card on file.  Bring a credit card or cash on the day of your procedure- please leave with a family member rather than bringing it into the preop area.    Current Visitor Policy:  Visitors must be free of fever and symptoms to be in our facilities.  No more than 2 visitors per patient are allowed.  Additional guidelines may vary, based on patient care area or patient's condition.  Patients in semiprivate rooms may have visitors, but visits should be coordinated so only two total visitors are in a room at a time due to space limitations.  Children younger than age 12 are allowed to visit inpatients.    Thank you for participating in your Preoperative Assessment visit today.    If you have any changes to your health or hospitalizations between now and your surgery, please call us at [phone].    Instructions given to patient via: MyChart, verbal, and printed copy

## 2023-08-01 NOTE — Progress Notes
SPINE CENTER HISTORY AND PHYSICAL    Chief Complaint   Patient presents with   ? Lower Back - Pre Operative Visit       Subjective   CHIEF COMPLAINT:  Back, buttocks and leg pain.     HISTORY OF PRESENT ILLNESS:  This is a 62 year old female seen in the office today for evaluation of her lumbar spine.  She reports she has had problems with her low back now for approximately three years, gradually getting worse through that period of time.  In the past six months, however, things have been dramatically worse for her.  She describes a combination of low back pain, buttocks, and leg symptoms.  The buttocks and leg pain are her biggest complaint.  It bothers her worse to stand and walk and is better if she is able to sit.  She has been through extensive conservative treatment completing formalized physical therapy approximately two years ago.  She did transition to a home exercise program which she tries to pursue, but it tends to aggravate things.  She has seen a pain management physician at an outside facility and also Dr. Fredna Dow here at [facility].  She has had a number of different epidural steroid injections.  Her last was in 2021 and she reports that those never provided her significant benefit.  There may have been some slight help from the last injection, but nothing significant.  She has had no prior surgical treatment of her low back.  She has had a prior ACDF performed by Dr. Leodis Liverpool in June of 2023, but nothing of the lumbar spine.  She completed an MRI which is available for review today.  She is currently not working.     PHYSICAL EXAMINATION:  This is a 62 year old female.  Mood and affect are within normal limits.  She stands 5 foot 3 inches tall and weighs 191 pounds.  She has a BMI of 33.94.  Examination of her back shows no significant pain to palpation.  Limited range of motion secondary to back discomfort.  Motor examination of the lower extremities is full to one time testing including EHL, anterior tibialis, quadriceps and hip flexors.  Sensation is intact distally.  Palpable pedal pulses.  Reflexes are normoreactive at the knees and ankles bilaterally.  No pain with manipulation of the hips.  Negative straight leg raise.  Eyes are PERRLA, respirations equal and unlabored.  Skin is warm and dry without areas of breakdown.     RADIOGRAPHIC EVALUATION:  X-rays of the lumbar spine obtained today show subtle deformity coming from the 3-4 and 4-5 disc spaces.  She is asymmetrically collapsed on the left at 3-4 and on the right at 4-5.  Overall it looks like she is balanced.  Lateral view shows a subtle spondylolisthesis at L4-5.      MRI available for review dated Apr 30, 2023 confirms the presence of diffuse degenerative changes throughout the lumbar spine.  The 2-3 disc is well maintained, but 3-4, 4-5, and 5-1 are all degenerative.  Axial images show severe stenosis at 4-5 and moderately at 3-4.  No significant stenosis at 5-1, although she does have a bulging disc at that level.     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    PLAN: I had a lengthy discussion with the patient about the problem and treatment alternatives.  We reviewed all the studies with her and discussed the pathoanatomy and the pathophysiology of the problem.  We went over her  options for treatment. She's completed conservative management including time, activity modification, pain medicines and epidurals and continues to have significant, unrelenting symptoms.  She wishes to proceed with surgery.  Surgery for this problem will entail L3-5 decompression and instrumented fusion.  We went over the surgery in detail including risks, benefits and convalescence.  The risks are death, paralysis, dural leak, chronic pain, pseudoarthrosis and blood loss. She understands the risks and agrees to proceed.  We'll plan on doing surgery on 08/13/23.  The history and physical portion was completed.  Informed consent was obtained. The patient was sent to pre-anesthesia testing.             Past Medical History:   Diagnosis Date   ? Anxiety 12/18/1998   ? Cirrhosis (HCC) 02/23/2019   ? 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 Depression 12/18/1998   ? Essential hypertension 12/18/2016   ? Generalized headaches     occasionally   ? Heart murmur 12/18/2017   ? Joint pain 12/19/2015   ? Spinal stenosis 06/22/2019   ? Ulcer 02/23/2019       Surgical History:   Procedure Laterality Date   ? HX CARPAL TUNNEL RELEASE Bilateral 2021   ? HX ARTHROSCOPIC SURGERY  12/18/2000   ? HX HYSTERECTOMY  12/19/1999   ? HX JOINT REPLACEMENT  10/04/2001   ? KNEE SURGERY  10/04/2001       family history includes Alcohol liver disease in her father; Arthritis in her mother; Back pain in her father; Diabetes in her paternal grandmother; Joint Pain in her father; Stroke in her father and paternal grandmother.    Social History     Socioeconomic History   ? Marital status: Married   Tobacco Use   ? Smoking status: Former     Current packs/day: 0.00     Average packs/day: 0.5 packs/day for 15.0 years (7.5 ttl pk-yrs)     Types: Cigarettes     Start date: 02/23/2019     Quit date: 04/25/2019     Years since quitting: 4.2   ? Smokeless tobacco: Former     Quit date: 04/25/2019   ? Tobacco comments:     Was not a constant smoker   Substance and Sexual Activity   ? Alcohol use: Not Currently   ? Drug use: Never   ? Sexual activity: Not Currently     Partners: Male     Birth control/protection: Pill       Allergies   Allergen Reactions   ? Morphine ANXIETY, ITCHING and REDNESS     Itchy, increased anxiety            Current Outpatient Medications:   ?  amlodipine-atorvastatin (CADUET) 10-80 mg tablet, , Disp: , Rfl:   ?  escitalopram oxalate (LEXAPRO) 10 mg tablet, , Disp: , Rfl:   ?  losartan (COZAAR) 50 mg tablet, , Disp: , Rfl:   ?  oxyCODONE/acetaminophen (PERCOCET) 5/325 mg tablet, , Disp: , Rfl:   ?  traMADoL (ULTRAM) 50 mg tablet, TWICE A DAY, Disp: , Rfl:   ? traZODone (DESYREL) 50 mg tablet, , Disp: , Rfl:   ?  vitamins, multiple tablet, Take one tablet by mouth daily., Disp: , Rfl:     Vitals:    08/01/23 1352   BP: 117/69   Pulse: 76   SpO2: 99%   PainSc: Nine   Weight: 86.6 kg (191 lb)   Height: 160 cm (5' 3)  Oswestry Total Score:: 54    Pain Score: Nine    Body mass index is 33.83 kg/m?Marland Kitchen    Review of Systems   Constitutional:  Positive for activity change and fatigue. Negative for appetite change, chills, diaphoresis, fever and unexpected weight change.   HENT:  Positive for rhinorrhea, sinus pressure and sinus pain. Negative for congestion, dental problem, drooling, ear discharge, ear pain, facial swelling, hearing loss, mouth sores, nosebleeds, postnasal drip, sneezing, sore throat, tinnitus, trouble swallowing and voice change.    Eyes:  Negative for photophobia, pain, discharge, redness, itching and visual disturbance.   Respiratory:  Negative for apnea, cough, choking, chest tightness, shortness of breath, wheezing and stridor.    Cardiovascular:  Negative for chest pain, palpitations and leg swelling.   Endocrine: Negative for cold intolerance, heat intolerance, polydipsia, polyphagia and polyuria.   Genitourinary:  Negative for decreased urine volume, difficulty urinating, dyspareunia, dysuria, enuresis, flank pain, frequency, genital sores, hematuria, menstrual problem, pelvic pain, urgency, vaginal bleeding, vaginal discharge and vaginal pain.   Musculoskeletal:  Positive for arthralgias, back pain, gait problem and myalgias. Negative for joint swelling, neck pain and neck stiffness.   Skin:  Negative for color change, pallor, rash and wound.   Allergic/Immunologic: Negative for environmental allergies, food allergies and immunocompromised state.   Neurological:  Positive for dizziness, weakness, light-headedness, numbness and headaches. Negative for tremors, seizures, syncope, facial asymmetry and speech difficulty.   Hematological:  Negative for adenopathy. Bruises/bleeds easily.   Psychiatric/Behavioral:  Positive for agitation and decreased concentration. Negative for behavioral problems, confusion, dysphoric mood, hallucinations, self-injury, sleep disturbance and suicidal ideas. The patient is not nervous/anxious and is not hyperactive.                  Answers submitted by the patient for this visit:  Review Of Systems (Submitted on 07/26/2023)  Crying: No

## 2023-08-02 ENCOUNTER — Encounter: Admit: 2023-08-02 | Discharge: 2023-08-02 | Payer: MEDICARE

## 2023-08-02 DIAGNOSIS — K7460 Unspecified cirrhosis of liver: Secondary | ICD-10-CM

## 2023-08-02 DIAGNOSIS — F1190 Opioid use, unspecified, uncomplicated: Secondary | ICD-10-CM

## 2023-08-02 DIAGNOSIS — M2550 Pain in unspecified joint: Secondary | ICD-10-CM

## 2023-08-02 DIAGNOSIS — G8929 Other chronic pain: Secondary | ICD-10-CM

## 2023-08-02 DIAGNOSIS — D649 Anemia, unspecified: Secondary | ICD-10-CM

## 2023-08-02 DIAGNOSIS — M549 Dorsalgia, unspecified: Secondary | ICD-10-CM

## 2023-08-02 DIAGNOSIS — IMO0002 Ulcer: Secondary | ICD-10-CM

## 2023-08-02 DIAGNOSIS — R0609 Other forms of dyspnea: Secondary | ICD-10-CM

## 2023-08-02 DIAGNOSIS — R519 Generalized headaches: Secondary | ICD-10-CM

## 2023-08-02 DIAGNOSIS — M5136 Other intervertebral disc degeneration, lumbar region: Secondary | ICD-10-CM

## 2023-08-02 DIAGNOSIS — M4800 Spinal stenosis, site unspecified: Secondary | ICD-10-CM

## 2023-08-02 DIAGNOSIS — R791 Abnormal coagulation profile: Secondary | ICD-10-CM

## 2023-08-02 DIAGNOSIS — Z01818 Encounter for other preprocedural examination: Secondary | ICD-10-CM

## 2023-08-02 DIAGNOSIS — G629 Polyneuropathy, unspecified: Secondary | ICD-10-CM

## 2023-08-02 DIAGNOSIS — F32A Depression: Secondary | ICD-10-CM

## 2023-08-02 DIAGNOSIS — E785 Hyperlipidemia, unspecified: Secondary | ICD-10-CM

## 2023-08-02 DIAGNOSIS — I10 Essential (primary) hypertension: Secondary | ICD-10-CM

## 2023-08-02 DIAGNOSIS — R011 Cardiac murmur, unspecified: Secondary | ICD-10-CM

## 2023-08-02 DIAGNOSIS — R799 Abnormal finding of blood chemistry, unspecified: Secondary | ICD-10-CM

## 2023-08-02 DIAGNOSIS — F419 Anxiety disorder, unspecified: Secondary | ICD-10-CM

## 2023-08-02 LAB — CBC AND DIFF
ABSOLUTE NEUTROPHIL: 2.5 10*3/uL (ref 1.8–7.0)
BASOPHILS %: 0 % (ref 0–2)
EOSINOPHILS %: 3 % (ref 0–5)
HEMATOCRIT: 38 % (ref 36–45)
HEMOGLOBIN: 13 g/dL (ref 12.0–15.0)
LYMPHOCYTES %: 33 % (ref 24–44)
MCH: 34 pg — ABNORMAL HIGH (ref 26–34)
MCHC: 34 g/dL (ref 32.0–36.0)
MCV: 100 FL (ref 80–100)
MONOCYTES %: 10 % (ref 4–12)
MPV: 10 FL (ref 7–11)
NEUTROPHILS %: 54 % (ref 41–77)
PLATELET COUNT: 78 10*3/uL — ABNORMAL LOW (ref 150–400)
RBC COUNT: 3.8 M/UL — ABNORMAL LOW (ref 4.0–5.0)
RDW: 14 % (ref 11–15)
WBC COUNT: 4.6 10*3/uL (ref 4.5–11.0)

## 2023-08-12 NOTE — Anesthesia Pre-Procedure Evaluation
Anesthesia Pre-Procedure Evaluation    Name: Sandra Leach      MRN: [id number]     DOB: [birth year]     Age: 62 y.o.     Sex: female   _________________________________________________________________________     Procedure Info:   Procedure Information       Date/Time: 08/13/23 0800    Procedure: Decompressive lumbar laminectomy and posterolateral instrumented fusion lumbar 3-5. - 4 poster frame, drill, cell saver, spinal monitoring. Rep: Charlynn Grimes, 4 hrs    Location: MAIN OR 37 / Main OR/Periop    Surgeons: Karie Chimera, MD            Physical Assessment  Vital Signs (last filed in past 24 hours):  Height: 160 cm (5' 3) (08/27 9518)  Weight: 88.2 kg (194 lb 7.1 oz) (08/27 8416)      Patient History   Allergies   Allergen Reactions    Morphine ANXIETY, ITCHING and REDNESS     Itchy, increased anxiety           Current Medications    Medication Directions   atorvastatin (LIPITOR) 80 mg tablet Take one tablet by mouth daily.   baclofen (LIORESAL) 10 mg tablet Take one tablet by mouth three times daily as needed for Muscle Cramps.   gabapentin (NEURONTIN) 300 mg capsule Take one capsule by mouth twice daily.   losartan (COZAAR) 50 mg tablet Take one tablet by mouth daily.   nadoloL (CORGARD) 20 mg tablet Take one-half tablet by mouth daily.   oxyCODONE 10 mg tablet Take one-half tablet by mouth daily.   sertraline (ZOLOFT) 100 mg tablet Take one tablet by mouth daily.   traZODone (DESYREL) 50 mg tablet Take one tablet by mouth at bedtime daily.   vitamins, multiple tablet Take one tablet by mouth daily.       Review of Systems/Medical History      Patient summary reviewed  Pertinent labs reviewed    PONV Screening: Non-smoker, Postoperative opioids and Female sex    No history of anesthetic complications    No family history of anesthetic complications      Airway - negative        Pulmonary       Not a current smoker (former, reports social use, quit 2020)        No indications/hx of asthma      no COPD         No recent URI        No Obstructive Sleep Apnea      Cardiovascular       Recent diagnostic studies:          ECG, echocardiogram and stress test          2020  Conclusions:   ? The left ventricular systolic function is normal.   ? The visually estimated ejection fraction is between 60-65%.   ? There is mild tricuspid valve regurgitation.           Exercise tolerance: <4 METS (denies sx of CP; activities limited due to severe back pain)      Beta Blocker therapy: No      Beta blockers within 24 hours: n/a      Hypertension, well controlled          Valvular problems/murmurs (mild tricuspid valve regurgitation on ECHO 2020):          No past MI      No coronary artery bypass graft  No PTCA            No dysrhythmias    No angina      Hyperlipidemia (on treatment)      No orthopnea      Dyspnea on exertion (chronic/stable, feels sx are related to her weight and back pain)      No syncope      GI/Hepatic/Renal             No GERD        Liver disease:       cirrhosis and esophageal varices       No renal disease:         No nausea      No vomiting        Neuro/Psych       No seizures        No CVA      Neuropathy (BLE and bilateral hands)      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)        Psychiatric history          Depression          Anxiety      Notes some short term memory loss      Musculoskeletal         Neck pain      Back pain (Lumbar stenosis)      Arthritis:  osteo        Endocrine/Other       No diabetes        Anemia      History of blood transfusion (due to GI bleed; denies reaction)        No malignancy      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 No decreased breath sounds.    Abdominal Findings:       Obese      Abdominal exam deferred    Neurological Findings:       Alert and oriented x 3    Normal mental status    Constitutional findings: Negative      No acute distress       Previous Airway Procedure Notes Displaying the 3 most recent records   No records found.         Patient Lines/Drains/Airways Status       Active Lines:       None                  Diagnostic Tests  Hematology:   Lab Results   Component Value Date    HGB 13.3 08/01/2023    HCT 38.6 08/01/2023    PLTCT 78 08/01/2023    WBC 4.6 08/01/2023    NEUT 54 08/01/2023    ANC 2.52 08/01/2023    ALC 1.55 08/01/2023  MONA 10 08/01/2023    AMC 0.45 08/01/2023    EOSA 3 08/01/2023    ABC 0.01 08/01/2023    MCV 100.0 08/01/2023    MCH 34.4 08/01/2023    MCHC 34.4 08/01/2023    MPV 10.2 08/01/2023    RDW 14.0 08/01/2023         General Chemistry:   Lab Results   Component Value Date    NA 142 08/01/2023    K 3.8 08/01/2023    CL 105 08/01/2023    CO2 27 08/01/2023    GAP 10 08/01/2023    BUN 17 08/01/2023    CR 0.69 08/01/2023    GLU 81 08/01/2023    CA 9.7 08/01/2023    ALBUMIN 4.5 08/01/2023    TOTBILI 0.6 08/01/2023      Coagulation:   Lab Results   Component Value Date    PT 12.6 08/01/2023    INR 1.1 08/01/2023       Echo 04/28/2019  Left Ventricle: The left ventricular chamber size is normal.  There is normal left ventricular wall thickness.  The left ventricular systolic function is normal.  The visually estimated ejection fraction is between 60-65%.  Right Ventricle: Normal right ventricular cavity size.  The right ventricular systolic function is normal.  Atria: Both atria are normal in size.  Aortic Valve: There is a normal trileaflet aortic valve.  There is no aortic valve stenosis.  There is no aortic valve regurgitation.  Peak velocity is 2.01 m/s.  Peak gradient is 15 mmHg.  Mean gradient is 8.00 mmHg.  Mitral Valve: The mitral valve appears normal.  There is no mitral valve regurgitation.  There is no mitral valve stenosis.  Pulmonic Valve: The pulmonic valve is normal.  There is no pulmonic valve regurgitation.  There is no pulmonic valve stenosis.  Tricuspid Valve: Normal tricuspid valve structure.  There is mild tricuspid valve regurgitation.  Tricuspid regurgitation envelope is inadequate for calculation of right ventricular systolic pressure.  Great Vessels: All visible segments of the aorta are normal in size.  Pericardium/Pleural: There is no evidence of pericardial effusion.         PAC Plan    Anesthesia Plan    ASA score: 3   Plan: general  NPO status: acceptable      Informed Consent  Anesthetic plan and risks discussed with patient.  Use of blood products discussed with patient      Plan discussed with: anesthesiologist, CRNA and surgeon/proceduralist.  Comments: (ERAS)      Alerts

## 2023-08-13 ENCOUNTER — Encounter: Admit: 2023-08-13 | Discharge: 2023-08-13 | Payer: MEDICARE

## 2023-08-13 DIAGNOSIS — G629 Polyneuropathy, unspecified: Secondary | ICD-10-CM

## 2023-08-13 DIAGNOSIS — F1190 Opioid use, unspecified, uncomplicated: Secondary | ICD-10-CM

## 2023-08-13 DIAGNOSIS — R519 Generalized headaches: Secondary | ICD-10-CM

## 2023-08-13 DIAGNOSIS — M4800 Spinal stenosis, site unspecified: Secondary | ICD-10-CM

## 2023-08-13 DIAGNOSIS — D649 Anemia, unspecified: Secondary | ICD-10-CM

## 2023-08-13 DIAGNOSIS — I10 Essential (primary) hypertension: Secondary | ICD-10-CM

## 2023-08-13 DIAGNOSIS — M549 Dorsalgia, unspecified: Secondary | ICD-10-CM

## 2023-08-13 DIAGNOSIS — R0609 Other forms of dyspnea: Secondary | ICD-10-CM

## 2023-08-13 DIAGNOSIS — F419 Anxiety disorder, unspecified: Secondary | ICD-10-CM

## 2023-08-13 DIAGNOSIS — IMO0002 Ulcer: Secondary | ICD-10-CM

## 2023-08-13 DIAGNOSIS — R011 Cardiac murmur, unspecified: Secondary | ICD-10-CM

## 2023-08-13 DIAGNOSIS — M2550 Pain in unspecified joint: Secondary | ICD-10-CM

## 2023-08-13 DIAGNOSIS — F32A Depression: Secondary | ICD-10-CM

## 2023-08-13 DIAGNOSIS — M5136 Other intervertebral disc degeneration, lumbar region: Secondary | ICD-10-CM

## 2023-08-13 DIAGNOSIS — E785 Hyperlipidemia, unspecified: Secondary | ICD-10-CM

## 2023-08-13 DIAGNOSIS — K7460 Unspecified cirrhosis of liver: Secondary | ICD-10-CM

## 2023-08-13 MED ORDER — LIDOCAINE (PF) 200 MG/10 ML (2 %) IJ SYRG
INTRAVENOUS | 0 refills | Status: DC
Start: 2023-08-13 — End: 2023-08-13

## 2023-08-13 MED ORDER — GLYCOPYRROLATE 0.2 MG/ML IJ SOLN
INTRAVENOUS | 0 refills | Status: DC
Start: 2023-08-13 — End: 2023-08-13

## 2023-08-13 MED ORDER — ARTIFICIAL TEARS (PF) SINGLE DOSE DROPS GROUP
OPHTHALMIC | 0 refills | Status: DC
Start: 2023-08-13 — End: 2023-08-13

## 2023-08-13 MED ORDER — PROPOFOL INJ 10 MG/ML IV VIAL
INTRAVENOUS | 0 refills | Status: DC
Start: 2023-08-13 — End: 2023-08-13

## 2023-08-13 MED ORDER — ONDANSETRON HCL (PF) 4 MG/2 ML IJ SOLN
INTRAVENOUS | 0 refills | Status: DC
Start: 2023-08-13 — End: 2023-08-13

## 2023-08-13 MED ORDER — PROPOFOL 10 MG/ML IV EMUL 100 ML (INFUSION)(AM)(OR)
INTRAVENOUS | 0 refills | Status: DC
Start: 2023-08-13 — End: 2023-08-13
  Administered 2023-08-13: 13:00:00 150 ug/kg/min via INTRAVENOUS

## 2023-08-13 MED ORDER — REMIFENTANYL 1000MCG IN NS 20ML (OR)
INTRAVENOUS | 0 refills | Status: DC
Start: 2023-08-13 — End: 2023-08-13
  Administered 2023-08-13 (×2): .05 ug/kg/min via INTRAVENOUS

## 2023-08-13 MED ORDER — PHENYLEPHRINE HCL IN 0.9% NACL 1 MG/10 ML (100 MCG/ML) IV SYRG
INTRAVENOUS | 0 refills | Status: DC
Start: 2023-08-13 — End: 2023-08-13

## 2023-08-13 MED ORDER — ROCURONIUM 10 MG/ML IV SOLN
INTRAVENOUS | 0 refills | Status: DC
Start: 2023-08-13 — End: 2023-08-13

## 2023-08-13 MED ORDER — SUCCINYLCHOLINE CHLORIDE 20 MG/ML IJ SOLN
INTRAVENOUS | 0 refills | Status: DC
Start: 2023-08-13 — End: 2023-08-13

## 2023-08-13 MED ORDER — HYDROMORPHONE (PF) 2 MG/ML IJ SYRG
INTRAVENOUS | 0 refills | Status: DC
Start: 2023-08-13 — End: 2023-08-13

## 2023-08-13 MED ORDER — FENTANYL CITRATE (PF) 50 MCG/ML IJ SOLN
INTRAVENOUS | 0 refills | Status: DC
Start: 2023-08-13 — End: 2023-08-13

## 2023-08-13 MED ORDER — PHENYLEPHRINE 40 MCG/ML IN NS IV DRIP (STD CONC)
INTRAVENOUS | 0 refills | Status: DC
Start: 2023-08-13 — End: 2023-08-13
  Administered 2023-08-13 (×2): .4 ug/kg/min via INTRAVENOUS

## 2023-08-13 MED ORDER — DEXAMETHASONE SODIUM PHOSPHATE 4 MG/ML IJ SOLN
INTRAVENOUS | 0 refills | Status: DC
Start: 2023-08-13 — End: 2023-08-13

## 2023-08-13 MED ADMIN — FENTANYL CITRATE (PF) 50 MCG/ML IJ SOLN [3037]: 25 ug | INTRAVENOUS | @ 19:00:00 | Stop: 2023-08-13 | NDC 00409909412

## 2023-08-13 MED ADMIN — ACETAMINOPHEN 500 MG PO TAB [102]: 1000 mg | ORAL | @ 12:00:00 | Stop: 2023-08-13 | NDC 00904673061

## 2023-08-13 MED ADMIN — ACETAMINOPHEN 1,000 MG/100 ML (10 MG/ML) IV SOLN [305632]: 1000 mg | INTRAVENOUS | @ 21:00:00 | Stop: 2023-08-13 | NDC 00264410090

## 2023-08-13 MED ADMIN — OXYCODONE SR 10 MG PO 12HR TABLET [323983]: 10 mg | ORAL | @ 12:00:00 | Stop: 2023-08-13 | NDC 59011041020

## 2023-08-13 MED ADMIN — HYDROMORPHONE (PF) 2 MG/ML IJ SYRG [163476]: 0.5 mg | INTRAVENOUS | @ 19:00:00 | Stop: 2023-08-13 | NDC 00409131203

## 2023-08-13 MED ADMIN — LACTATED RINGERS IV SOLP [4318]: 1000 mL | INTRAVENOUS | @ 12:00:00 | Stop: 2023-08-13 | NDC 00338011704

## 2023-08-13 MED ADMIN — METHOCARBAMOL 100 MG/ML IJ SOLN [4970]: 1000 mg | INTRAVENOUS | @ 20:00:00 | Stop: 2023-08-13 | NDC 71288071611

## 2023-08-13 MED ADMIN — CELECOXIB 200 MG PO CAP [76958]: 200 mg | ORAL | @ 12:00:00 | Stop: 2023-08-13 | NDC 00904650361

## 2023-08-13 MED ADMIN — CEFAZOLIN 2 GRAM IV SOLR [462609]: 2 g | INTRAVENOUS | @ 14:00:00 | Stop: 2023-08-13 | NDC 00143913901

## 2023-08-13 MED ADMIN — LACTATED RINGERS IV SOLP [4318]: 1000.0000 mL | INTRAVENOUS | @ 16:00:00 | Stop: 2023-08-13 | NDC 00338011704

## 2023-08-13 MED ADMIN — SODIUM CHLORIDE 0.9 % IV SOLP [27838]: 1000.0000 mL | INTRAVENOUS | @ 23:00:00 | Stop: 2023-08-15 | NDC 00338004904

## 2023-08-13 MED ADMIN — CEFAZOLIN 2 GRAM IV SOLR [462609]: 2 g | INTRAVENOUS | @ 18:00:00 | Stop: 2023-08-13 | NDC 00143913901

## 2023-08-13 MED ADMIN — FENTANYL PCA 500 MCG/50 ML SYR (STD CONC)(ADULT)(PREMADE) [213018]: 50.0000 mL | INTRAVENOUS | @ 21:00:00 | NDC 73177010205

## 2023-08-13 MED ADMIN — HYDROMORPHONE (PF) 2 MG/ML IJ SYRG [163476]: 0.5 mg | INTRAVENOUS | @ 20:00:00 | Stop: 2023-08-13 | NDC 00409131203

## 2023-08-13 MED ADMIN — OXYCODONE 5 MG PO TAB [10814]: 10 mg | ORAL | @ 19:00:00 | Stop: 2023-08-13 | NDC 00406055223

## 2023-08-13 MED ADMIN — FENTANYL CITRATE (PF) 50 MCG/ML IJ SOLN [3037]: 25 ug | INTRAVENOUS | @ 20:00:00 | Stop: 2023-08-13 | NDC 00409909412

## 2023-08-13 MED ADMIN — LACTATED RINGERS IV SOLP [4318]: 1000 mL | INTRAVENOUS | @ 21:00:00 | Stop: 2023-08-13 | NDC 00338011704

## 2023-08-13 MED ADMIN — CYCLOBENZAPRINE 10 MG PO TAB [2017]: 5 mg | ORAL | @ 12:00:00 | Stop: 2023-08-13 | NDC 72888001405

## 2023-08-13 MED ADMIN — LACTATED RINGERS IV SOLP [4318]: 1000 mL | INTRAVENOUS | @ 22:00:00 | Stop: 2023-08-13 | NDC 00338011704

## 2023-08-13 NOTE — Anesthesia Post-Procedure Evaluation
Post-Anesthesia Evaluation    Name: KATALEYAH TINDELL      MRN: [id number]     DOB: 06/09/1961     Age: 62 y.o.     Sex: female   __________________________________________________________________________     Procedure Information       Anesthesia Start Date/Time: 08/13/23 0801    Procedure: Decompressive lumbar laminectomy and posterolateral instrumented fusion lumbar 3-5. (Spine Lumbar) - 4 poster frame, drill, cell saver, spinal monitoring. Rep: Charlynn Grimes, 4 hrs    Location: MAIN OR 37 / Main OR/Periop    Surgeons: Karie Chimera, MD            Post-Anesthesia Vitals  BP: 124/73 (08/27 1545)  Pulse: 50 (08/27 1545)  Respirations: 10 PER MINUTE (08/27 1545)  SpO2: 97 % (08/27 1545)  O2 Device: Nasal cannula (08/27 1545)   Vitals Value Taken Time   BP 124/73 08/13/23 1545   Temp 36.7 ?C (98.1 ?F) 08/13/23 1349   Pulse 50 08/13/23 1545   Respirations 10 PER MINUTE 08/13/23 1545   SpO2 97 % 08/13/23 1545   O2 Device Nasal cannula 08/13/23 1545   ABP     ART BP           Post Anesthesia Evaluation Note    Evaluation location: Pre/Post  Patient participation: recovered; patient participated in evaluation  Level of consciousness: sleepy but conscious    Pain score: 7 (tolerable. ready to go to room upstairs)  Pain management: adequate    Hydration: normovolemia  Temperature: 36.0?C - 38.4?C  Airway patency: adequate    Perioperative Events       Post-op nausea and vomiting: no PONV    Postoperative Status  Cardiovascular status: hemodynamically stable  Respiratory status: spontaneous ventilation and supplemental oxygen  Follow-up needed: none        Perioperative Events  There were no known complications for this encounter.

## 2023-08-14 MED ADMIN — ACETAMINOPHEN 500 MG PO TAB [102]: 1000 mg | ORAL | @ 21:00:00 | NDC 00904673061

## 2023-08-14 MED ADMIN — OXYCODONE 5 MG PO TAB [10814]: 15 mg | ORAL | @ 17:00:00 | Stop: 2023-08-15 | NDC 00406055223

## 2023-08-14 MED ADMIN — ACETAMINOPHEN 500 MG PO TAB [102]: 1000 mg | ORAL | @ 14:00:00 | NDC 00904673061

## 2023-08-14 MED ADMIN — CEFAZOLIN 2 GRAM IV SOLR [462609]: 2 g | INTRAVENOUS | @ 01:00:00 | Stop: 2023-08-14 | NDC 00143913901

## 2023-08-14 MED ADMIN — LOSARTAN 50 MG PO TAB [76938]: 50 mg | ORAL | @ 14:00:00 | NDC 68084034711

## 2023-08-14 MED ADMIN — ACETAMINOPHEN 500 MG PO TAB [102]: 1000 mg | ORAL | @ 05:00:00 | NDC 00904673061

## 2023-08-14 MED ADMIN — BACLOFEN 10 MG PO TAB [860]: 10 mg | ORAL | @ 01:00:00 | NDC 00904647561

## 2023-08-14 MED ADMIN — GABAPENTIN 300 MG PO CAP [18308]: 300 mg | ORAL | @ 14:00:00 | NDC 67877022305

## 2023-08-14 MED ADMIN — GABAPENTIN 300 MG PO CAP [18308]: 300 mg | ORAL | @ 01:00:00 | NDC 67877022305

## 2023-08-14 MED ADMIN — OXYCODONE 5 MG PO TAB [10814]: 15 mg | ORAL | @ 21:00:00 | Stop: 2023-08-15 | NDC 00406055223

## 2023-08-14 MED ADMIN — OXYCODONE 10 MG PO TAB [166908]: 10 mg | ORAL | @ 14:00:00 | Stop: 2023-08-15 | NDC 00406851023

## 2023-08-14 MED ADMIN — CEFAZOLIN 2 GRAM IV SOLR [462609]: 2 g | INTRAVENOUS | @ 09:00:00 | Stop: 2023-08-14 | NDC 00143913901

## 2023-08-14 MED ADMIN — ATORVASTATIN 40 MG PO TAB [77113]: 80 mg | ORAL | @ 14:00:00 | NDC 00904629261

## 2023-08-14 MED ADMIN — SERTRALINE 100 MG PO TAB [78401]: 100 mg | ORAL | @ 14:00:00 | NDC 00904692661

## 2023-08-14 MED ADMIN — BACLOFEN 10 MG PO TAB [860]: 10 mg | ORAL | @ 21:00:00 | NDC 00904647561

## 2023-08-14 MED ADMIN — NADOLOL 20 MG PO TAB [5330]: 10 mg | ORAL | @ 14:00:00 | NDC 00904707007

## 2023-08-14 MED ADMIN — SODIUM CHLORIDE 0.9 % IV SOLP [27838]: 1000.0000 mL | INTRAVENOUS | @ 09:00:00 | Stop: 2023-08-15 | NDC 00338004904

## 2023-08-14 MED ADMIN — CELECOXIB 200 MG PO CAP [76958]: 200 mg | ORAL | @ 14:00:00 | NDC 00904650361

## 2023-08-14 MED ADMIN — MAGNESIUM HYDROXIDE 400 MG/5 ML PO SUSP [79944]: 30 mL | ORAL | @ 14:00:00 | NDC 00121043130

## 2023-08-14 MED ADMIN — POLYETHYLENE GLYCOL 3350 17 GRAM PO PWPK [25424]: 17 g | ORAL | @ 14:00:00 | NDC 00904693186

## 2023-08-14 MED ADMIN — DOCUSATE SODIUM 100 MG PO CAP [2566]: 100 mg | ORAL | @ 17:00:00 | NDC 00904718361

## 2023-08-14 MED ADMIN — CELECOXIB 200 MG PO CAP [76958]: 200 mg | ORAL | @ 01:00:00 | NDC 00904650361

## 2023-08-14 MED ADMIN — POLYETHYLENE GLYCOL 3350 17 GRAM PO PWPK [25424]: 17 g | ORAL | @ 01:00:00 | NDC 00904693186

## 2023-08-14 MED ADMIN — TRAZODONE 50 MG PO TAB [8085]: 50 mg | ORAL | @ 01:00:00 | NDC 00904686861

## 2023-08-14 MED ADMIN — DOCUSATE SODIUM 100 MG PO CAP [2566]: 100 mg | ORAL | @ 01:00:00 | NDC 00904718361

## 2023-08-15 ENCOUNTER — Inpatient Hospital Stay: Admit: 2023-08-15 | Discharge: 2023-08-15 | Payer: MEDICARE

## 2023-08-15 ENCOUNTER — Encounter: Admit: 2023-08-15 | Discharge: 2023-08-15 | Payer: MEDICARE

## 2023-08-15 DIAGNOSIS — G629 Polyneuropathy, unspecified: Secondary | ICD-10-CM

## 2023-08-15 DIAGNOSIS — F32A Depression: Secondary | ICD-10-CM

## 2023-08-15 DIAGNOSIS — M4800 Spinal stenosis, site unspecified: Secondary | ICD-10-CM

## 2023-08-15 DIAGNOSIS — E785 Hyperlipidemia, unspecified: Secondary | ICD-10-CM

## 2023-08-15 DIAGNOSIS — R0609 Other forms of dyspnea: Secondary | ICD-10-CM

## 2023-08-15 DIAGNOSIS — M549 Dorsalgia, unspecified: Secondary | ICD-10-CM

## 2023-08-15 DIAGNOSIS — F1190 Opioid use, unspecified, uncomplicated: Secondary | ICD-10-CM

## 2023-08-15 DIAGNOSIS — M5136 Other intervertebral disc degeneration, lumbar region: Secondary | ICD-10-CM

## 2023-08-15 DIAGNOSIS — F419 Anxiety disorder, unspecified: Secondary | ICD-10-CM

## 2023-08-15 DIAGNOSIS — I10 Essential (primary) hypertension: Secondary | ICD-10-CM

## 2023-08-15 DIAGNOSIS — R011 Cardiac murmur, unspecified: Secondary | ICD-10-CM

## 2023-08-15 DIAGNOSIS — IMO0002 Ulcer: Secondary | ICD-10-CM

## 2023-08-15 DIAGNOSIS — M2550 Pain in unspecified joint: Secondary | ICD-10-CM

## 2023-08-15 DIAGNOSIS — R519 Generalized headaches: Secondary | ICD-10-CM

## 2023-08-15 DIAGNOSIS — D649 Anemia, unspecified: Secondary | ICD-10-CM

## 2023-08-15 DIAGNOSIS — K7460 Unspecified cirrhosis of liver: Secondary | ICD-10-CM

## 2023-08-15 MED ADMIN — ATORVASTATIN 40 MG PO TAB [77113]: 80 mg | ORAL | @ 15:00:00 | NDC 00904629261

## 2023-08-15 MED ADMIN — OXYCODONE 5 MG PO TAB [10814]: 5 mg | ORAL | @ 23:00:00 | NDC 00406055223

## 2023-08-15 MED ADMIN — GABAPENTIN 300 MG PO CAP [18308]: 300 mg | ORAL | @ 15:00:00 | NDC 67877022305

## 2023-08-15 MED ADMIN — CELECOXIB 200 MG PO CAP [76958]: 200 mg | ORAL | @ 15:00:00 | NDC 00904650361

## 2023-08-15 MED ADMIN — DOCUSATE SODIUM 100 MG PO CAP [2566]: 100 mg | ORAL | @ 01:00:00 | NDC 00904718361

## 2023-08-15 MED ADMIN — CELECOXIB 200 MG PO CAP [76958]: 200 mg | ORAL | @ 01:00:00 | NDC 00904650361

## 2023-08-15 MED ADMIN — LOSARTAN 50 MG PO TAB [76938]: 50 mg | ORAL | @ 15:00:00 | NDC 68084034711

## 2023-08-15 MED ADMIN — TRAZODONE 50 MG PO TAB [8085]: 50 mg | ORAL | @ 01:00:00 | NDC 00904686861

## 2023-08-15 MED ADMIN — OXYCODONE 15 MG PO TAB [28899]: 15 mg | ORAL | @ 01:00:00 | Stop: 2023-08-15 | NDC 68094000559

## 2023-08-15 MED ADMIN — GABAPENTIN 300 MG PO CAP [18308]: 300 mg | ORAL | @ 01:00:00 | NDC 67877022305

## 2023-08-15 MED ADMIN — POLYETHYLENE GLYCOL 3350 17 GRAM PO PWPK [25424]: 17 g | ORAL | @ 01:00:00 | NDC 00904693186

## 2023-08-15 MED ADMIN — MAGNESIUM HYDROXIDE 400 MG/5 ML PO SUSP [79944]: 30 mL | ORAL | @ 15:00:00 | NDC 00121043130

## 2023-08-15 MED ADMIN — NADOLOL 20 MG PO TAB [5330]: 10 mg | ORAL | @ 15:00:00 | NDC 00904707007

## 2023-08-15 MED ADMIN — ACETAMINOPHEN 500 MG PO TAB [102]: 1000 mg | ORAL | @ 15:00:00 | NDC 00904673061

## 2023-08-15 MED ADMIN — OXYCODONE 5 MG PO TAB [10814]: 10 mg | ORAL | @ 06:00:00 | Stop: 2023-08-15 | NDC 00406055223

## 2023-08-15 MED ADMIN — SERTRALINE 100 MG PO TAB [78401]: 100 mg | ORAL | @ 15:00:00 | NDC 00904692661

## 2023-08-15 MED ADMIN — DOCUSATE SODIUM 100 MG PO CAP [2566]: 100 mg | ORAL | @ 15:00:00 | NDC 00904718361

## 2023-08-15 MED ADMIN — POLYETHYLENE GLYCOL 3350 17 GRAM PO PWPK [25424]: 17 g | ORAL | @ 15:00:00 | NDC 00904693186

## 2023-08-16 ENCOUNTER — Encounter: Admit: 2023-08-16 | Discharge: 2023-08-16 | Payer: MEDICARE

## 2023-08-16 MED ADMIN — POLYETHYLENE GLYCOL 3350 17 GRAM PO PWPK [25424]: 17 g | ORAL | @ 02:00:00 | NDC 00904693186

## 2023-08-16 MED ADMIN — DOCUSATE SODIUM 100 MG PO CAP [2566]: 100 mg | ORAL | @ 13:00:00 | Stop: 2023-08-16 | NDC 00904718361

## 2023-08-16 MED ADMIN — MAGNESIUM HYDROXIDE 400 MG/5 ML PO SUSP [79944]: 30 mL | ORAL | @ 13:00:00 | Stop: 2023-08-16 | NDC 00121043130

## 2023-08-16 MED ADMIN — ACETAMINOPHEN 500 MG PO TAB [102]: 1000 mg | ORAL | @ 11:00:00 | Stop: 2023-08-16 | NDC 00904673061

## 2023-08-16 MED ADMIN — LOSARTAN 50 MG PO TAB [76938]: 50 mg | ORAL | @ 13:00:00 | Stop: 2023-08-16 | NDC 68084034711

## 2023-08-16 MED ADMIN — ATORVASTATIN 40 MG PO TAB [77113]: 80 mg | ORAL | @ 13:00:00 | Stop: 2023-08-16 | NDC 00904629261

## 2023-08-16 MED ADMIN — POLYETHYLENE GLYCOL 3350 17 GRAM PO PWPK [25424]: 17 g | ORAL | @ 13:00:00 | Stop: 2023-08-16 | NDC 00904693186

## 2023-08-16 MED ADMIN — ACETAMINOPHEN 500 MG PO TAB [102]: 1000 mg | ORAL | @ 19:00:00 | Stop: 2023-08-16 | NDC 00904673061

## 2023-08-16 MED ADMIN — SERTRALINE 100 MG PO TAB [78401]: 100 mg | ORAL | @ 13:00:00 | Stop: 2023-08-16 | NDC 00904692661

## 2023-08-16 MED ADMIN — CELECOXIB 200 MG PO CAP [76958]: 200 mg | ORAL | @ 13:00:00 | Stop: 2023-08-16 | NDC 00904650361

## 2023-08-16 MED ADMIN — OXYCODONE 5 MG PO TAB [10814]: 5 mg | ORAL | @ 14:00:00 | Stop: 2023-08-16 | NDC 00406055223

## 2023-08-16 MED ADMIN — ACETAMINOPHEN 500 MG PO TAB [102]: 1000 mg | ORAL | @ 02:00:00 | NDC 00904673061

## 2023-08-16 MED ADMIN — OXYCODONE 5 MG PO TAB [10814]: 5 mg | ORAL | @ 19:00:00 | Stop: 2023-08-16 | NDC 00406055223

## 2023-08-16 MED ADMIN — GABAPENTIN 300 MG PO CAP [18308]: 300 mg | ORAL | @ 02:00:00 | NDC 67877022305

## 2023-08-16 MED ADMIN — CELECOXIB 200 MG PO CAP [76958]: 200 mg | ORAL | @ 02:00:00 | NDC 00904650361

## 2023-08-16 MED ADMIN — OXYCODONE 5 MG PO TAB [10814]: 5 mg | ORAL | @ 11:00:00 | Stop: 2023-08-16 | NDC 00406055223

## 2023-08-16 MED ADMIN — GABAPENTIN 300 MG PO CAP [18308]: 300 mg | ORAL | @ 13:00:00 | Stop: 2023-08-16 | NDC 67877022305

## 2023-08-16 MED ADMIN — DOCUSATE SODIUM 100 MG PO CAP [2566]: 100 mg | ORAL | @ 02:00:00 | NDC 00904718361

## 2023-08-27 ENCOUNTER — Encounter: Admit: 2023-08-27 | Discharge: 2023-08-27 | Payer: MEDICARE

## 2023-08-27 DIAGNOSIS — M48062 Spinal stenosis, lumbar region with neurogenic claudication: Secondary | ICD-10-CM

## 2023-09-05 ENCOUNTER — Encounter: Admit: 2023-09-05 | Discharge: 2023-09-05 | Payer: MEDICARE

## 2023-09-05 ENCOUNTER — Ambulatory Visit: Admit: 2023-09-05 | Discharge: 2023-09-05 | Payer: MEDICARE

## 2023-09-05 DIAGNOSIS — G629 Polyneuropathy, unspecified: Secondary | ICD-10-CM

## 2023-09-05 DIAGNOSIS — F419 Anxiety disorder, unspecified: Secondary | ICD-10-CM

## 2023-09-05 DIAGNOSIS — R011 Cardiac murmur, unspecified: Secondary | ICD-10-CM

## 2023-09-05 DIAGNOSIS — IMO0002 Ulcer: Secondary | ICD-10-CM

## 2023-09-05 DIAGNOSIS — M2550 Pain in unspecified joint: Secondary | ICD-10-CM

## 2023-09-05 DIAGNOSIS — Z981 Arthrodesis status: Secondary | ICD-10-CM

## 2023-09-05 DIAGNOSIS — M549 Dorsalgia, unspecified: Secondary | ICD-10-CM

## 2023-09-05 DIAGNOSIS — R519 Generalized headaches: Secondary | ICD-10-CM

## 2023-09-05 DIAGNOSIS — E785 Hyperlipidemia, unspecified: Secondary | ICD-10-CM

## 2023-09-05 DIAGNOSIS — M48062 Spinal stenosis, lumbar region with neurogenic claudication: Secondary | ICD-10-CM

## 2023-09-05 DIAGNOSIS — K7460 Unspecified cirrhosis of liver: Secondary | ICD-10-CM

## 2023-09-05 DIAGNOSIS — F1190 Opioid use, unspecified, uncomplicated: Secondary | ICD-10-CM

## 2023-09-05 DIAGNOSIS — M4800 Spinal stenosis, site unspecified: Secondary | ICD-10-CM

## 2023-09-05 DIAGNOSIS — F32A Depression: Secondary | ICD-10-CM

## 2023-09-05 DIAGNOSIS — M532X6 Spinal instabilities, lumbar region: Secondary | ICD-10-CM

## 2023-09-05 DIAGNOSIS — M5136 Other intervertebral disc degeneration, lumbar region: Secondary | ICD-10-CM

## 2023-09-05 DIAGNOSIS — I10 Essential (primary) hypertension: Secondary | ICD-10-CM

## 2023-09-05 DIAGNOSIS — M48061 Spinal stenosis, lumbar region without neurogenic claudication: Secondary | ICD-10-CM

## 2023-09-05 DIAGNOSIS — R0609 Other forms of dyspnea: Secondary | ICD-10-CM

## 2023-09-05 DIAGNOSIS — D649 Anemia, unspecified: Secondary | ICD-10-CM

## 2023-09-17 ENCOUNTER — Encounter: Admit: 2023-09-17 | Discharge: 2023-09-17 | Payer: MEDICARE

## 2023-09-17 NOTE — Telephone Encounter
Pt calls to report blood pressure systolic in the 70s. She is dizzy, nauseated and has some slight chest pressure. Pt has only taken 1/2 tab of oxycodone 5mg  today. Advised pt that she needs to report to the nearest ED for assessment. She was asked to call back to report findings to our office.

## 2023-10-07 ENCOUNTER — Encounter: Admit: 2023-10-07 | Discharge: 2023-10-07 | Payer: MEDICARE

## 2023-10-07 DIAGNOSIS — M48061 Spinal stenosis, lumbar region without neurogenic claudication: Secondary | ICD-10-CM

## 2023-10-07 DIAGNOSIS — Z981 Arthrodesis status: Secondary | ICD-10-CM

## 2023-10-10 ENCOUNTER — Ambulatory Visit: Admit: 2023-10-10 | Discharge: 2023-10-10 | Payer: MEDICARE

## 2023-10-10 ENCOUNTER — Encounter: Admit: 2023-10-10 | Discharge: 2023-10-10 | Payer: MEDICARE

## 2023-10-10 DIAGNOSIS — M2550 Pain in unspecified joint: Secondary | ICD-10-CM

## 2023-10-10 DIAGNOSIS — M48061 Spinal stenosis, lumbar region without neurogenic claudication: Secondary | ICD-10-CM

## 2023-10-10 DIAGNOSIS — R519 Generalized headaches: Secondary | ICD-10-CM

## 2023-10-10 DIAGNOSIS — F419 Anxiety disorder, unspecified: Secondary | ICD-10-CM

## 2023-10-10 DIAGNOSIS — F1190 Opioid use, unspecified, uncomplicated: Secondary | ICD-10-CM

## 2023-10-10 DIAGNOSIS — IMO0002 Ulcer: Secondary | ICD-10-CM

## 2023-10-10 DIAGNOSIS — G629 Polyneuropathy, unspecified: Secondary | ICD-10-CM

## 2023-10-10 DIAGNOSIS — D649 Anemia, unspecified: Secondary | ICD-10-CM

## 2023-10-10 DIAGNOSIS — R011 Cardiac murmur, unspecified: Secondary | ICD-10-CM

## 2023-10-10 DIAGNOSIS — F32A Depression: Secondary | ICD-10-CM

## 2023-10-10 DIAGNOSIS — M48062 Spinal stenosis, lumbar region with neurogenic claudication: Secondary | ICD-10-CM

## 2023-10-10 DIAGNOSIS — K7460 Unspecified cirrhosis of liver: Secondary | ICD-10-CM

## 2023-10-10 DIAGNOSIS — M51369 Degenerative disc disease, lumbar: Secondary | ICD-10-CM

## 2023-10-10 DIAGNOSIS — E785 Hyperlipidemia, unspecified: Secondary | ICD-10-CM

## 2023-10-10 DIAGNOSIS — Z981 Arthrodesis status: Secondary | ICD-10-CM

## 2023-10-10 DIAGNOSIS — M4800 Spinal stenosis, site unspecified: Secondary | ICD-10-CM

## 2023-10-10 DIAGNOSIS — M549 Dorsalgia, unspecified: Secondary | ICD-10-CM

## 2023-10-10 DIAGNOSIS — R0609 Other forms of dyspnea: Secondary | ICD-10-CM

## 2023-10-10 DIAGNOSIS — I10 Essential (primary) hypertension: Secondary | ICD-10-CM

## 2023-10-10 NOTE — Progress Notes
SPINE CENTER CLINIC NOTE        Dictation on: 10/10/2023  5:11 PM by: Dorna Bloom            Review of Systems    Current Outpatient Medications:     acetaminophen (TYLENOL EXTRA STRENGTH) 500 mg tablet, Take two tablets by mouth every 6 hours as needed for Pain. Max of 4,000 mg of acetaminophen in 24 hours.  Indications: pain, Disp: , Rfl:     atorvastatin (LIPITOR) 80 mg tablet, Take one tablet by mouth daily., Disp: , Rfl:     baclofen (LIORESAL) 10 mg tablet, Take one tablet by mouth three times daily as needed for Muscle Cramps., Disp: , Rfl:     gabapentin (NEURONTIN) 300 mg capsule, Take one capsule by mouth twice daily., Disp: , Rfl:     losartan (COZAAR) 50 mg tablet, Take one tablet by mouth daily., Disp: , Rfl:     nadoloL (CORGARD) 20 mg tablet, Take one-half tablet by mouth daily., Disp: , Rfl:     oxyCODONE (ROXICODONE) 5 mg tablet, Take one tablet to two tablets by mouth every 4 hours as needed for Pain. Indications: pain Do not start before August 24, 2023., Disp: 60 tablet, Rfl: 0    oxyCODONE (ROXICODONE) 5 mg tablet, Take one tablet by mouth every 6 hours as needed. Indications: pain Do not start before August 31, 2023., Disp: 40 tablet, Rfl: 0    polyethylene glycol 3350 (MIRALAX) 17 g packet, Take one packet by mouth twice daily. Indications: constipation, Disp: , Rfl:     sertraline (ZOLOFT) 100 mg tablet, Take one tablet by mouth daily., Disp: , Rfl:     traZODone (DESYREL) 50 mg tablet, Take one tablet by mouth at bedtime daily., Disp: , Rfl:     vitamins, multiple tablet, Take one tablet by mouth daily., Disp: , Rfl:   Allergies   Allergen Reactions    Morphine ANXIETY, ITCHING and REDNESS     Itchy, increased anxiety        Physical Exam  Vitals:    10/10/23 0914   BP: 138/68   Pulse: 105   SpO2: 99%   PainSc: Two   Weight: 86.6 kg (191 lb)   Height: 160 cm (5' 3)     Oswestry Total Score:: 42  Pain Score: Two  Body mass index is 33.83 kg/m?Marland Kitchen

## 2023-11-13 ENCOUNTER — Encounter: Admit: 2023-11-13 | Discharge: 2023-11-13 | Payer: MEDICARE

## 2023-11-13 DIAGNOSIS — M48062 Spinal stenosis, lumbar region with neurogenic claudication: Secondary | ICD-10-CM

## 2023-11-13 DIAGNOSIS — Z981 Arthrodesis status: Secondary | ICD-10-CM

## 2023-11-15 ENCOUNTER — Encounter: Admit: 2023-11-15 | Discharge: 2023-11-15 | Payer: MEDICARE

## 2023-11-21 ENCOUNTER — Ambulatory Visit: Admit: 2023-11-21 | Discharge: 2023-11-22 | Payer: MEDICARE

## 2023-11-21 ENCOUNTER — Encounter: Admit: 2023-11-21 | Discharge: 2023-11-21 | Payer: MEDICARE

## 2023-11-21 ENCOUNTER — Ambulatory Visit: Admit: 2023-11-21 | Discharge: 2023-11-21 | Payer: MEDICARE

## 2023-11-21 DIAGNOSIS — M48062 Spinal stenosis, lumbar region with neurogenic claudication: Secondary | ICD-10-CM

## 2023-11-21 NOTE — Progress Notes
[facility] NOTE        Dictation on: 11/21/2023  9:13 AM by: Lillia Pauls            Review of Systems    Current Outpatient Medications:     acetaminophen (TYLENOL EXTRA STRENGTH) 500 mg tablet, Take two tablets by mouth every 6 hours as needed for Pain. Max of 4,000 mg of acetaminophen in 24 hours.  Indications: pain, Disp: , Rfl:     atorvastatin (LIPITOR) 80 mg tablet, Take one tablet by mouth daily., Disp: , Rfl:     baclofen (LIORESAL) 10 mg tablet, Take one tablet by mouth three times daily as needed for Muscle Cramps., Disp: , Rfl:     gabapentin (NEURONTIN) 300 mg capsule, Take one capsule by mouth twice daily., Disp: , Rfl:     losartan (COZAAR) 50 mg tablet, Take one tablet by mouth daily., Disp: , Rfl:     nadoloL (CORGARD) 20 mg tablet, Take one-half tablet by mouth daily., Disp: , Rfl:     oxyCODONE (ROXICODONE) 5 mg tablet, Take one tablet to two tablets by mouth every 4 hours as needed for Pain. Indications: pain Do not start before August 24, 2023., Disp: 60 tablet, Rfl: 0    oxyCODONE (ROXICODONE) 5 mg tablet, Take one tablet by mouth every 6 hours as needed. Indications: pain Do not start before August 31, 2023., Disp: 40 tablet, Rfl: 0    polyethylene glycol 3350 (MIRALAX) 17 g packet, Take one packet by mouth twice daily. Indications: constipation, Disp: , Rfl:     sertraline (ZOLOFT) 100 mg tablet, Take one tablet by mouth daily., Disp: , Rfl:     traZODone (DESYREL) 50 mg tablet, Take one tablet by mouth at bedtime daily., Disp: , Rfl:     vitamins, multiple tablet, Take one tablet by mouth daily., Disp: , Rfl:   Allergies   Allergen Reactions    Morphine ANXIETY, ITCHING and REDNESS     Itchy, increased anxiety        Physical Exam  Vitals:    11/21/23 0905   BP: 127/65   Pulse: 77   SpO2: 100%   PainSc: Five   Weight: 86.6 kg (191 lb)   Height: 160 cm (5' 3)     Oswestry Total Score:: (Patient-Rptd) 42  Pain Score: Five  Body mass index is 33.83 kg/m?Marland Kitchen

## 2024-02-12 ENCOUNTER — Encounter: Admit: 2024-02-12 | Discharge: 2024-02-12 | Payer: MEDICARE

## 2024-02-12 DIAGNOSIS — M48062 Spinal stenosis, lumbar region with neurogenic claudication: Secondary | ICD-10-CM

## 2024-02-12 DIAGNOSIS — Z981 Arthrodesis status: Secondary | ICD-10-CM

## 2024-02-13 ENCOUNTER — Encounter: Admit: 2024-02-13 | Discharge: 2024-02-13 | Payer: MEDICARE

## 2024-02-20 ENCOUNTER — Ambulatory Visit: Admit: 2024-02-20 | Discharge: 2024-02-21 | Payer: MEDICARE

## 2024-02-20 ENCOUNTER — Encounter: Admit: 2024-02-20 | Discharge: 2024-02-20 | Payer: MEDICARE

## 2024-02-20 ENCOUNTER — Ambulatory Visit: Admit: 2024-02-20 | Discharge: 2024-02-20 | Payer: MEDICARE

## 2024-04-02 ENCOUNTER — Encounter: Admit: 2024-04-02 | Discharge: 2024-04-02 | Payer: MEDICARE

## 2024-07-20 ENCOUNTER — Encounter: Admit: 2024-07-20 | Discharge: 2024-07-20 | Payer: MEDICARE

## 2024-07-22 ENCOUNTER — Encounter: Admit: 2024-07-22 | Discharge: 2024-07-22 | Payer: MEDICARE

## 2024-07-22 DIAGNOSIS — M48062 Spinal stenosis, lumbar region with neurogenic claudication: Principal | ICD-10-CM

## 2024-07-22 DIAGNOSIS — Z981 Arthrodesis status: Secondary | ICD-10-CM

## 2024-07-23 ENCOUNTER — Encounter: Admit: 2024-07-23 | Discharge: 2024-07-23 | Payer: MEDICARE

## 2024-07-23 ENCOUNTER — Ambulatory Visit: Admit: 2024-07-23 | Discharge: 2024-07-23 | Payer: MEDICARE

## 2024-08-26 ENCOUNTER — Encounter: Admit: 2024-08-26 | Discharge: 2024-08-26 | Payer: MEDICARE

## 2024-08-27 ENCOUNTER — Encounter: Admit: 2024-08-27 | Discharge: 2024-08-27 | Payer: MEDICARE

## 2024-09-03 ENCOUNTER — Encounter: Admit: 2024-09-03 | Discharge: 2024-09-03 | Payer: MEDICARE

## 2024-09-03 ENCOUNTER — Ambulatory Visit: Admit: 2024-09-03 | Discharge: 2024-09-03 | Payer: MEDICARE
